# Patient Record
Sex: Male | Born: 1955 | Race: White | Hispanic: No | Marital: Married | Smoking: Former smoker
Health system: Southern US, Community
[De-identification: ages and names within clinical notes are randomized; demographics above are authoritative.]

## PROBLEM LIST (undated history)

## (undated) DIAGNOSIS — E785 Hyperlipidemia, unspecified: Secondary | ICD-10-CM

## (undated) DIAGNOSIS — S21339A Puncture wound without foreign body of unspecified front wall of thorax with penetration into thoracic cavity, initial encounter: Secondary | ICD-10-CM

## (undated) DIAGNOSIS — W3400XA Accidental discharge from unspecified firearms or gun, initial encounter: Secondary | ICD-10-CM

## (undated) DIAGNOSIS — Z8719 Personal history of other diseases of the digestive system: Secondary | ICD-10-CM

## (undated) DIAGNOSIS — I10 Essential (primary) hypertension: Secondary | ICD-10-CM

## (undated) HISTORY — DX: Personal history of other diseases of the digestive system: Z87.19

## (undated) HISTORY — PX: ABDOMINAL SURGERY: SHX537

---

## 2006-10-27 ENCOUNTER — Ambulatory Visit: Payer: Self-pay | Admitting: Gastroenterology

## 2010-08-21 ENCOUNTER — Ambulatory Visit: Payer: Self-pay

## 2011-10-01 ENCOUNTER — Emergency Department: Payer: Self-pay | Admitting: Emergency Medicine

## 2012-04-27 ENCOUNTER — Observation Stay: Payer: Self-pay | Admitting: Surgery

## 2012-04-27 LAB — ETHANOL
Ethanol %: 0.065 % (ref 0.000–0.080)
Ethanol: 65 mg/dL

## 2012-04-27 LAB — COMPREHENSIVE METABOLIC PANEL
Albumin: 4.4 g/dL (ref 3.4–5.0)
Bilirubin,Total: 0.3 mg/dL (ref 0.2–1.0)
Calcium, Total: 8.7 mg/dL (ref 8.5–10.1)
Creatinine: 0.79 mg/dL (ref 0.60–1.30)
EGFR (African American): >60
EGFR (Non-African Amer.): >60
Glucose: 110 mg/dL — ABNORMAL HIGH (ref 65–99)
SGOT(AST): 56 U/L — ABNORMAL HIGH (ref 15–37)
SGPT (ALT): 55 U/L
Sodium: 141 mmol/L (ref 136–145)
Total Protein: 8.1 g/dL (ref 6.4–8.2)

## 2012-04-27 LAB — CBC
HCT: 51.5 % (ref 40.0–52.0)
MCV: 95 fL (ref 80–100)
RDW: 13.9 % (ref 11.5–14.5)

## 2012-04-27 LAB — APTT: Activated PTT: 27.4 secs (ref 23.6–35.9)

## 2012-04-27 LAB — TROPONIN I: Troponin-I: <0.02 ng/mL

## 2012-04-27 LAB — PROTIME-INR: Prothrombin Time: 12.9 secs (ref 11.5–14.7)

## 2012-04-28 LAB — BASIC METABOLIC PANEL
BUN: 14 mg/dL (ref 7–18)
EGFR (African American): >60
EGFR (Non-African Amer.): >60
Glucose: 116 mg/dL — ABNORMAL HIGH (ref 65–99)
Osmolality: 279 (ref 275–301)
Potassium: 3.9 mmol/L (ref 3.5–5.1)

## 2012-04-28 LAB — CBC WITH DIFFERENTIAL/PLATELET
Basophil #: 0 10*3/uL (ref 0.0–0.1)
Basophil %: 0.5 %
Eosinophil #: 0 10*3/uL (ref 0.0–0.7)
Lymphocyte #: 1.2 10*3/uL (ref 1.0–3.6)
Lymphocyte %: 12.4 %
MCH: 30.3 pg (ref 26.0–34.0)
MCV: 94 fL (ref 80–100)
Monocyte #: 1 x10 3/mm (ref 0.2–1.0)
Monocyte %: 10 %
Neutrophil %: 76.9 %
RBC: 4.69 10*6/uL (ref 4.40–5.90)
WBC: 9.6 10*3/uL (ref 3.8–10.6)

## 2013-04-23 IMAGING — CR DG CHEST 1V PORT
1 series · 1 of 1 positions shown · non-contrast
Comparison: none

REASON FOR EXAM: rib fx
COMMENTS:

[portable]
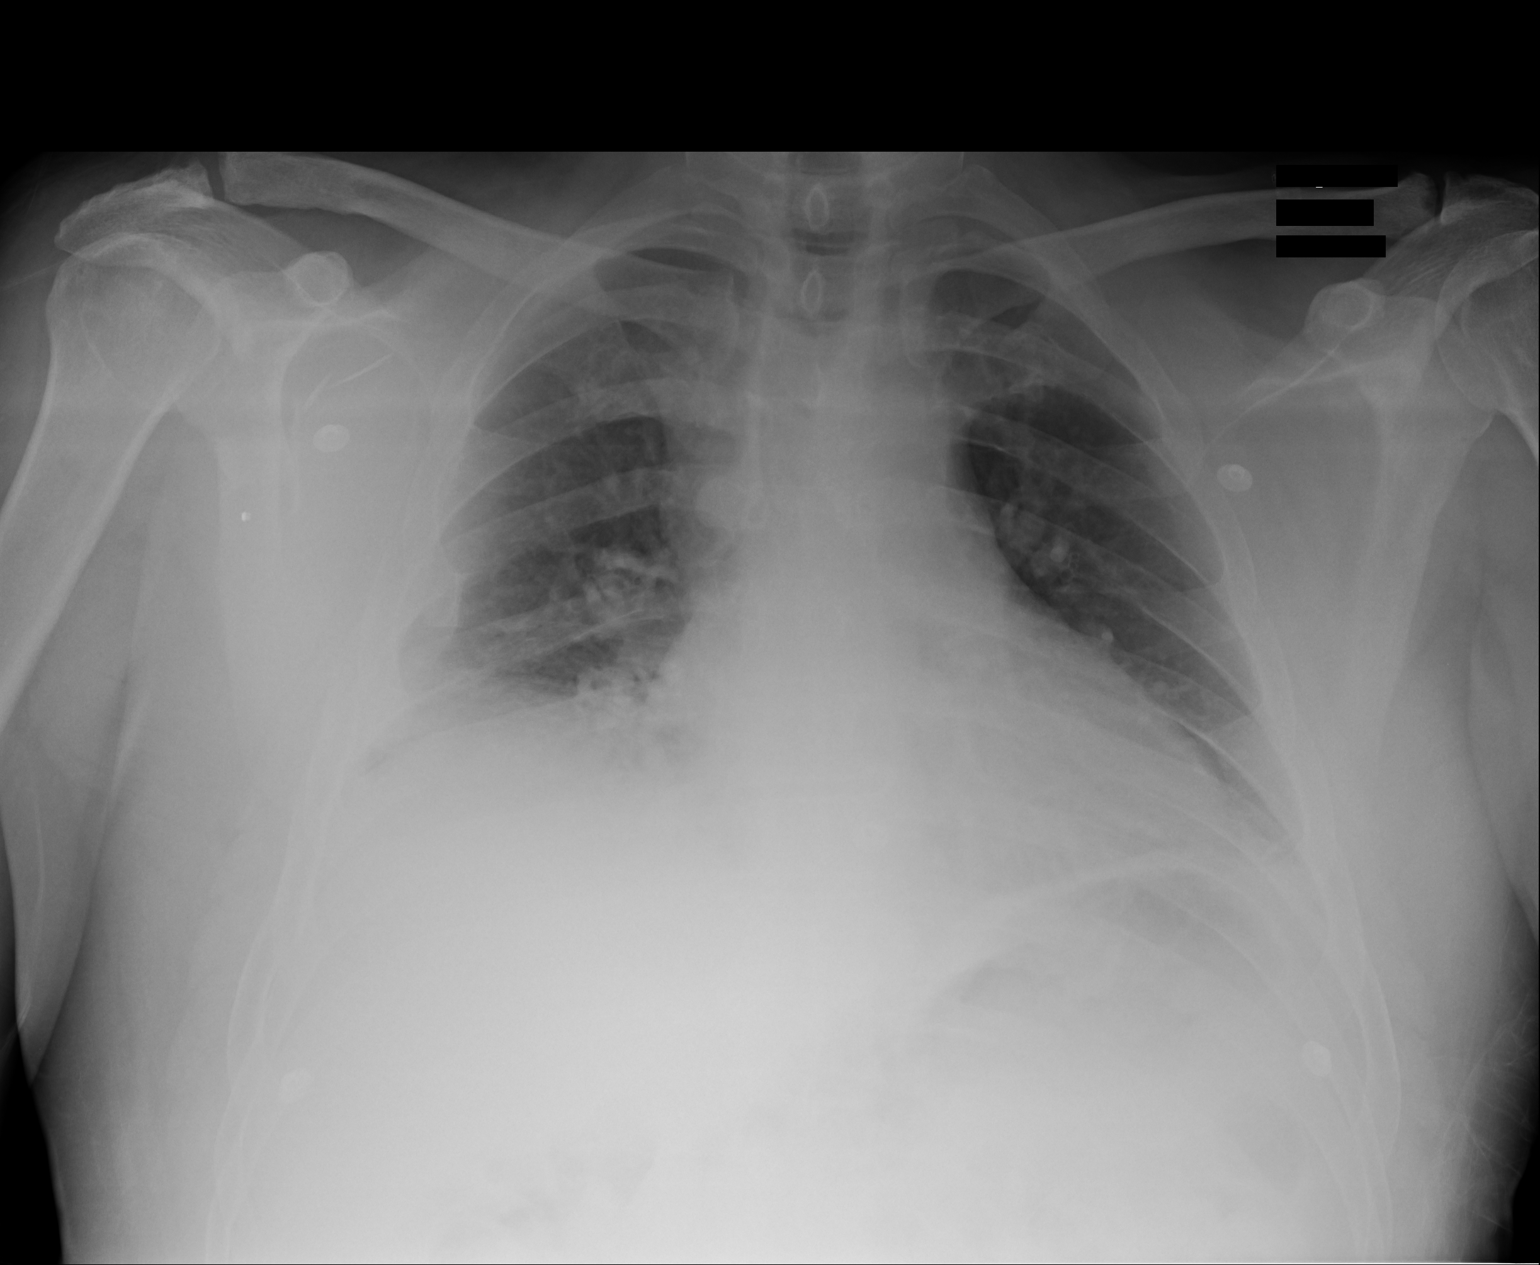

[1 of 1 positions shown; findings below may reference images not displayed]

PROCEDURE:     DXR - DXR PORTABLE CHEST SINGLE VIEW  - April 28, 2012  [DATE]

RESULT:     Comparison is made to prior study dated 08/21/2010.

The patient has taken a shallow inspiration. There is elevation of the right
hemidiaphragm and increased density in the right lung base. Prominence of
the interstitial markings is appreciated. The cardiac silhouette is
moderately enlarged. Surgical clips project within the right axilla region.
The visualized bony skeleton is grossly unremarkable though evaluation is
limited secondary to technique and the patient's shallow inspiration.
IMPRESSION: 1. Atelectasis versus infiltrate right lung base accentuated by the
patient's shallow inspiration.
2. Interstitial prominence which may represent a component of mild pulmonary
edema. Surveillance evaluation recommended.

## 2014-07-26 ENCOUNTER — Ambulatory Visit: Payer: Self-pay | Admitting: Family Medicine

## 2014-07-31 ENCOUNTER — Ambulatory Visit: Payer: Self-pay | Admitting: Gastroenterology

## 2014-08-30 ENCOUNTER — Ambulatory Visit: Payer: Self-pay | Admitting: Gastroenterology

## 2014-08-30 LAB — PROTIME-INR
INR: 1
Prothrombin Time: 12.6 secs (ref 11.5–14.7)

## 2014-08-30 LAB — PLATELET COUNT: Platelet: 274 10*3/uL (ref 150–440)

## 2014-09-04 LAB — PATHOLOGY REPORT

## 2014-09-24 DIAGNOSIS — Z86010 Personal history of colonic polyps: Secondary | ICD-10-CM | POA: Insufficient documentation
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
## 2015-06-12 ENCOUNTER — Other Ambulatory Visit: Payer: Self-pay

## 2015-06-12 MED ORDER — SIMVASTATIN 40 MG PO TABS: 40.0000 mg | ORAL_TABLET | Freq: Every day | ORAL | Status: DC

## 2015-06-12 NOTE — Telephone Encounter (Signed)
Called pharmacy and they stated that the rx is over a year old so a new one needs to be written. Patient was last seen on 03/21/15, practice partner number is [phone], and pharmacy is [facility] in [place].

## 2015-07-16 ENCOUNTER — Other Ambulatory Visit: Payer: Self-pay | Admitting: Unknown Physician Specialty

## 2015-09-17 ENCOUNTER — Other Ambulatory Visit: Payer: Self-pay | Admitting: Unknown Physician Specialty

## 2015-09-25 ENCOUNTER — Other Ambulatory Visit: Payer: Self-pay

## 2015-09-25 NOTE — Telephone Encounter (Signed)
LAST VISIT: 03/24/2015  Request for simvastatin 40 mg tab.   Practice Partner: [phone]

## 2015-09-26 MED ORDER — SIMVASTATIN 40 MG PO TABS: 40.0000 mg | ORAL_TABLET | Freq: Every day | ORAL | Status: DC

## 2015-09-26 NOTE — Telephone Encounter (Signed)
Called patient and told him that [facility] refilled his medication, but he also needs an appointment. He said he was at work right now and he didn't have access to find out, but he'd called tomorrow then said thank you, while I was telling him that we are closed tomorrow he hung up.   If he hasn't called back next week, I will call him again and try to schedule an appointment.

## 2015-11-16 ENCOUNTER — Emergency Department
Admission: EM | Admit: 2015-11-16 | Discharge: 2015-11-16 | Disposition: A | Discharge status: Home / Self Care | Payer: BLUE CROSS/BLUE SHIELD | Attending: Emergency Medicine | Admitting: Emergency Medicine

## 2015-11-16 ENCOUNTER — Emergency Department: Payer: BLUE CROSS/BLUE SHIELD

## 2015-11-16 ENCOUNTER — Encounter: Payer: Self-pay | Admitting: Emergency Medicine

## 2015-11-16 DIAGNOSIS — S29001A Unspecified injury of muscle and tendon of front wall of thorax, initial encounter: Secondary | ICD-10-CM | POA: Diagnosis present

## 2015-11-16 DIAGNOSIS — Y998 Other external cause status: Secondary | ICD-10-CM | POA: Diagnosis not present

## 2015-11-16 DIAGNOSIS — Y9389 Activity, other specified: Secondary | ICD-10-CM | POA: Diagnosis not present

## 2015-11-16 DIAGNOSIS — S299XXA Unspecified injury of thorax, initial encounter: Secondary | ICD-10-CM | POA: Insufficient documentation

## 2015-11-16 DIAGNOSIS — Z791 Long term (current) use of non-steroidal anti-inflammatories (NSAID): Secondary | ICD-10-CM | POA: Insufficient documentation

## 2015-11-16 DIAGNOSIS — Y92410 Unspecified street and highway as the place of occurrence of the external cause: Secondary | ICD-10-CM | POA: Diagnosis not present

## 2015-11-16 DIAGNOSIS — F17210 Nicotine dependence, cigarettes, uncomplicated: Secondary | ICD-10-CM | POA: Insufficient documentation

## 2015-11-16 DIAGNOSIS — S20211A Contusion of right front wall of thorax, initial encounter: Secondary | ICD-10-CM | POA: Diagnosis not present

## 2015-11-16 DIAGNOSIS — S8992XA Unspecified injury of left lower leg, initial encounter: Secondary | ICD-10-CM | POA: Diagnosis not present

## 2015-11-16 DIAGNOSIS — Z79899 Other long term (current) drug therapy: Secondary | ICD-10-CM | POA: Diagnosis not present

## 2015-11-16 DIAGNOSIS — I10 Essential (primary) hypertension: Secondary | ICD-10-CM | POA: Insufficient documentation

## 2015-11-16 HISTORY — DX: Puncture wound without foreign body of unspecified front wall of thorax with penetration into thoracic cavity, initial encounter: S21.339A

## 2015-11-16 HISTORY — DX: Essential (primary) hypertension: I10

## 2015-11-16 HISTORY — DX: Accidental discharge from unspecified firearms or gun, initial encounter: W34.00XA

## 2015-11-16 HISTORY — PX: FRACTURE SURGERY: SHX138

## 2015-11-16 HISTORY — DX: Hyperlipidemia, unspecified: E78.5

## 2015-11-16 MED ORDER — NAPROXEN 500 MG PO TABS
500.0000 mg | ORAL_TABLET | Freq: Once | ORAL | Status: AC
Start: 2015-11-16 — End: 2015-11-16
  Administered 2015-11-16: 500 mg via ORAL
  Filled 2015-11-16: qty 1

## 2015-11-16 MED ORDER — OXYCODONE HCL 5 MG PO TABS
5.0000 mg | ORAL_TABLET | Freq: Three times a day (TID) | ORAL | Status: AC | PRN
Start: 2015-11-16 — End: 2016-11-15

## 2015-11-16 MED ORDER — NAPROXEN 500 MG PO TABS: 500.0000 mg | ORAL_TABLET | Freq: Two times a day (BID) | ORAL | Status: DC
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
## 2015-11-16 NOTE — ED Notes (Signed)
Pt in XR. 
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
## 2015-11-20 ENCOUNTER — Emergency Department
Admission: EM | Admit: 2015-11-20 | Discharge: 2015-11-20 | Disposition: A | Discharge status: Home / Self Care | Payer: BLUE CROSS/BLUE SHIELD | Attending: Emergency Medicine | Admitting: Emergency Medicine

## 2015-11-20 ENCOUNTER — Encounter: Payer: Self-pay | Admitting: Emergency Medicine

## 2015-11-20 DIAGNOSIS — I10 Essential (primary) hypertension: Secondary | ICD-10-CM | POA: Insufficient documentation

## 2015-11-20 DIAGNOSIS — S20212D Contusion of left front wall of thorax, subsequent encounter: Secondary | ICD-10-CM | POA: Insufficient documentation

## 2015-11-20 DIAGNOSIS — R0781 Pleurodynia: Secondary | ICD-10-CM | POA: Diagnosis present

## 2015-11-20 DIAGNOSIS — F17210 Nicotine dependence, cigarettes, uncomplicated: Secondary | ICD-10-CM | POA: Diagnosis not present

## 2015-11-20 DIAGNOSIS — R0789 Other chest pain: Secondary | ICD-10-CM | POA: Diagnosis not present

## 2015-11-20 DIAGNOSIS — Z79899 Other long term (current) drug therapy: Secondary | ICD-10-CM | POA: Diagnosis not present

## 2015-11-20 MED ORDER — CYCLOBENZAPRINE HCL 5 MG PO TABS: 5.0000 mg | ORAL_TABLET | Freq: Three times a day (TID) | ORAL | Status: DC | PRN

## 2015-11-20 MED ORDER — KETOROLAC TROMETHAMINE 10 MG PO TABS: 10.0000 mg | ORAL_TABLET | Freq: Three times a day (TID) | ORAL | Status: DC

## 2015-11-20 MED ORDER — ORPHENADRINE CITRATE 30 MG/ML IJ SOLN
60.0000 mg | INTRAMUSCULAR | Status: AC
  Administered 2015-11-20: 60 mg via INTRAMUSCULAR
  Filled 2015-11-20: qty 2

## 2015-11-20 MED ORDER — KETOROLAC TROMETHAMINE 60 MG/2ML IM SOLN
60.0000 mg | Freq: Once | INTRAMUSCULAR | Status: AC
  Administered 2015-11-20: 60 mg via INTRAMUSCULAR
  Filled 2015-11-20: qty 2

## 2015-11-20 MED ORDER — TRAMADOL HCL 50 MG PO TABS: 50.0000 mg | ORAL_TABLET | Freq: Two times a day (BID) | ORAL | Status: DC

## 2015-11-20 NOTE — Discharge Instructions (Signed)
Chest Wall Pain Chest wall pain is pain in or around the bones and muscles of your chest. Sometimes, an injury causes this pain. Sometimes, the cause may not be known. This pain may take several weeks or longer to get better. HOME CARE Pay attention to any changes in your symptoms. Take these actions to help with your pain:  Rest as told by your doctor.  Avoid activities that cause pain. Try not to use your chest, belly (abdominal), or side muscles to lift heavy things.  If directed, apply ice to the painful area:  Put ice in a plastic bag.  Place a towel between your skin and the bag.  Leave the ice on for 20 minutes, 2-3 times per day.  Take over-the-counter and prescription medicines only as told by your doctor.  Do not use tobacco products, including cigarettes, chewing tobacco, and e-cigarettes. If you need help quitting, ask your doctor.  Keep all follow-up visits as told by your doctor. This is important. GET HELP IF:  You have a fever.  Your chest pain gets worse.  You have new symptoms. GET HELP RIGHT AWAY IF:  You feel sick to your stomach (nauseous) or you throw up (vomit).  You feel sweaty or light-headed.  You have a cough with phlegm (sputum) or you cough up blood.  You are short of breath.   This information is not intended to replace advice given to you by your health care provider. Make sure you discuss any questions you have with your health care provider.   Document Released: 04/19/2008 Document Revised: 07/23/2015 Document Reviewed: 01/27/2015 Elsevier Interactive Patient Education [employer]   Take the prescription meds as directed. Apply ice or warm compresses to promote healing. Follow-up with your provider for ongoing symptoms.
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
## 2015-11-20 NOTE — ED Notes (Addendum)
Pain to left ribs. Unable to be seen at any pcp per pt. 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 Pain worst with bending and sitting.
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
## 2016-09-30 ENCOUNTER — Other Ambulatory Visit: Payer: Self-pay | Admitting: Unknown Physician Specialty

## 2016-11-25 ENCOUNTER — Ambulatory Visit: Payer: BLUE CROSS/BLUE SHIELD | Admitting: Psychology

## 2021-07-06 ENCOUNTER — Emergency Department: Payer: BC Managed Care – PPO

## 2021-07-06 ENCOUNTER — Other Ambulatory Visit: Payer: Self-pay

## 2021-07-06 ENCOUNTER — Emergency Department
Admission: EM | Admit: 2021-07-06 | Discharge: 2021-07-06 | Disposition: A | Discharge status: Home / Self Care | Payer: BC Managed Care – PPO | Attending: Emergency Medicine | Admitting: Emergency Medicine

## 2021-07-06 DIAGNOSIS — R1110 Vomiting, unspecified: Secondary | ICD-10-CM | POA: Diagnosis not present

## 2021-07-06 DIAGNOSIS — R0789 Other chest pain: Secondary | ICD-10-CM | POA: Diagnosis not present

## 2021-07-06 DIAGNOSIS — Z5321 Procedure and treatment not carried out due to patient leaving prior to being seen by health care provider: Secondary | ICD-10-CM | POA: Insufficient documentation

## 2021-07-06 DIAGNOSIS — I10 Essential (primary) hypertension: Secondary | ICD-10-CM | POA: Diagnosis not present

## 2021-07-06 LAB — BASIC METABOLIC PANEL
Anion gap: 12 (ref 5–15)
BUN: 14 mg/dL (ref 8–23)
CO2: 21 mmol/L — ABNORMAL LOW (ref 22–32)
Calcium: 9.8 mg/dL (ref 8.9–10.3)
Chloride: 103 mmol/L (ref 98–111)
Creatinine, Ser: 0.64 mg/dL (ref 0.61–1.24)
GFR, Estimated: >60 mL/min (ref >60)
Glucose, Bld: 155 mg/dL — ABNORMAL HIGH (ref 70–99)
Potassium: 3.9 mmol/L (ref 3.5–5.1)
Sodium: 136 mmol/L (ref 135–145)

## 2021-07-06 LAB — CBC
HCT: 46.1 % (ref 39.0–52.0)
Hemoglobin: 16.7 g/dL (ref 13.0–17.0)
MCH: 33.9 pg (ref 26.0–34.0)
MCHC: 36.2 g/dL — ABNORMAL HIGH (ref 30.0–36.0)
MCV: 93.7 fL (ref 80.0–100.0)
Platelets: 362 10*3/uL (ref 150–400)
RBC: 4.92 MIL/uL (ref 4.22–5.81)
RDW: 14.3 % (ref 11.5–15.5)
WBC: 15.8 10*3/uL — ABNORMAL HIGH (ref 4.0–10.5)
nRBC: 0 % (ref 0.0–0.2)

## 2021-07-06 LAB — TROPONIN I (HIGH SENSITIVITY): Troponin I (High Sensitivity): 10 ng/L (ref <18)

## 2021-07-06 NOTE — ED Notes (Signed)
Patient was called and did not answer. Moved back to waiting area.

## 2021-07-06 NOTE — ED Triage Notes (Signed)
Pt comes with c/o CP that started yesterday. Pt also states HTN and vomiting.

## 2021-09-20 ENCOUNTER — Inpatient Hospital Stay (HOSPITAL_COMMUNITY): Payer: BC Managed Care – PPO

## 2021-09-20 ENCOUNTER — Emergency Department (HOSPITAL_COMMUNITY): Payer: BC Managed Care – PPO

## 2021-09-20 ENCOUNTER — Inpatient Hospital Stay (HOSPITAL_COMMUNITY)
Admission: EM | Admit: 2021-09-20 | Discharge: 2021-09-23 | DRG: 963 | Disposition: A | Discharge status: Home Health | Payer: BC Managed Care – PPO | Attending: General Surgery | Admitting: General Surgery

## 2021-09-20 DIAGNOSIS — Y92488 Other paved roadways as the place of occurrence of the external cause: Secondary | ICD-10-CM | POA: Diagnosis not present

## 2021-09-20 DIAGNOSIS — R578 Other shock: Secondary | ICD-10-CM | POA: Diagnosis present

## 2021-09-20 DIAGNOSIS — I10 Essential (primary) hypertension: Secondary | ICD-10-CM | POA: Diagnosis present

## 2021-09-20 DIAGNOSIS — Z79899 Other long term (current) drug therapy: Secondary | ICD-10-CM | POA: Diagnosis not present

## 2021-09-20 DIAGNOSIS — E785 Hyperlipidemia, unspecified: Secondary | ICD-10-CM | POA: Diagnosis present

## 2021-09-20 DIAGNOSIS — S32810A Multiple fractures of pelvis with stable disruption of pelvic ring, initial encounter for closed fracture: Secondary | ICD-10-CM | POA: Diagnosis present

## 2021-09-20 DIAGNOSIS — S50312A Abrasion of left elbow, initial encounter: Secondary | ICD-10-CM | POA: Diagnosis present

## 2021-09-20 DIAGNOSIS — S22071A Stable burst fracture of T9-T10 vertebra, initial encounter for closed fracture: Secondary | ICD-10-CM | POA: Diagnosis present

## 2021-09-20 DIAGNOSIS — J9601 Acute respiratory failure with hypoxia: Secondary | ICD-10-CM

## 2021-09-20 DIAGNOSIS — F17210 Nicotine dependence, cigarettes, uncomplicated: Secondary | ICD-10-CM | POA: Diagnosis present

## 2021-09-20 DIAGNOSIS — S76112A Strain of left quadriceps muscle, fascia and tendon, initial encounter: Secondary | ICD-10-CM

## 2021-09-20 DIAGNOSIS — E876 Hypokalemia: Secondary | ICD-10-CM | POA: Diagnosis present

## 2021-09-20 DIAGNOSIS — T148XXA Other injury of unspecified body region, initial encounter: Secondary | ICD-10-CM

## 2021-09-20 DIAGNOSIS — K5720 Diverticulitis of large intestine with perforation and abscess without bleeding: Secondary | ICD-10-CM | POA: Diagnosis present

## 2021-09-20 DIAGNOSIS — J939 Pneumothorax, unspecified: Secondary | ICD-10-CM

## 2021-09-20 DIAGNOSIS — J9690 Respiratory failure, unspecified, unspecified whether with hypoxia or hypercapnia: Secondary | ICD-10-CM

## 2021-09-20 DIAGNOSIS — S2242XA Multiple fractures of ribs, left side, initial encounter for closed fracture: Secondary | ICD-10-CM | POA: Diagnosis present

## 2021-09-20 DIAGNOSIS — T1490XA Injury, unspecified, initial encounter: Secondary | ICD-10-CM

## 2021-09-20 DIAGNOSIS — Z20822 Contact with and (suspected) exposure to covid-19: Secondary | ICD-10-CM | POA: Diagnosis present

## 2021-09-20 DIAGNOSIS — S32119A Unspecified Zone I fracture of sacrum, initial encounter for closed fracture: Secondary | ICD-10-CM | POA: Diagnosis present

## 2021-09-20 DIAGNOSIS — S60812A Abrasion of left wrist, initial encounter: Secondary | ICD-10-CM | POA: Diagnosis present

## 2021-09-20 DIAGNOSIS — R0789 Other chest pain: Secondary | ICD-10-CM | POA: Diagnosis present

## 2021-09-20 DIAGNOSIS — E43 Unspecified severe protein-calorie malnutrition: Secondary | ICD-10-CM | POA: Insufficient documentation

## 2021-09-20 DIAGNOSIS — S329XXA Fracture of unspecified parts of lumbosacral spine and pelvis, initial encounter for closed fracture: Secondary | ICD-10-CM | POA: Diagnosis present

## 2021-09-20 DIAGNOSIS — S22088A Other fracture of T11-T12 vertebra, initial encounter for closed fracture: Secondary | ICD-10-CM | POA: Diagnosis present

## 2021-09-20 DIAGNOSIS — S270XXA Traumatic pneumothorax, initial encounter: Secondary | ICD-10-CM | POA: Diagnosis present

## 2021-09-20 LAB — RESP PANEL BY RT-PCR (FLU A&B, COVID) ARPGX2
Influenza A by PCR: NEGATIVE
Influenza B by PCR: NEGATIVE
SARS Coronavirus 2 by RT PCR: NEGATIVE

## 2021-09-20 LAB — COMPREHENSIVE METABOLIC PANEL
ALT: 37 U/L (ref 0–44)
AST: 47 U/L — ABNORMAL HIGH (ref 15–41)
Albumin: 4 g/dL (ref 3.5–5.0)
Alkaline Phosphatase: 90 U/L (ref 38–126)
Anion gap: 16 — ABNORMAL HIGH (ref 5–15)
BUN: 13 mg/dL (ref 8–23)
CO2: 16 mmol/L — ABNORMAL LOW (ref 22–32)
Calcium: 8.9 mg/dL (ref 8.9–10.3)
Chloride: 103 mmol/L (ref 98–111)
Creatinine, Ser: 1.17 mg/dL (ref 0.61–1.24)
GFR, Estimated: >60 mL/min (ref >60)
Glucose, Bld: 182 mg/dL — ABNORMAL HIGH (ref 70–99)
Potassium: 3.8 mmol/L (ref 3.5–5.1)
Sodium: 135 mmol/L (ref 135–145)
Total Bilirubin: 0.8 mg/dL (ref 0.3–1.2)
Total Protein: 7.4 g/dL (ref 6.5–8.1)

## 2021-09-20 LAB — CBC
HCT: 47 % (ref 39.0–52.0)
HCT: 43.1 % (ref 39.0–52.0)
HCT: 40.3 % (ref 39.0–52.0)
Hemoglobin: 16.4 g/dL (ref 13.0–17.0)
Hemoglobin: 14.5 g/dL (ref 13.0–17.0)
Hemoglobin: 14.2 g/dL (ref 13.0–17.0)
MCH: 32.8 pg (ref 26.0–34.0)
MCH: 32.4 pg (ref 26.0–34.0)
MCH: 32.7 pg (ref 26.0–34.0)
MCHC: 34.9 g/dL (ref 30.0–36.0)
MCHC: 33.6 g/dL (ref 30.0–36.0)
MCHC: 35.2 g/dL (ref 30.0–36.0)
MCV: 94 fL (ref 80.0–100.0)
MCV: 96.2 fL (ref 80.0–100.0)
MCV: 92.9 fL (ref 80.0–100.0)
Platelets: 276 10*3/uL (ref 150–400)
Platelets: 259 10*3/uL (ref 150–400)
Platelets: 218 10*3/uL (ref 150–400)
RBC: 5 MIL/uL (ref 4.22–5.81)
RBC: 4.48 MIL/uL (ref 4.22–5.81)
RBC: 4.34 MIL/uL (ref 4.22–5.81)
RDW: 14 % (ref 11.5–15.5)
RDW: 14.2 % (ref 11.5–15.5)
RDW: 14.3 % (ref 11.5–15.5)
WBC: 21 10*3/uL — ABNORMAL HIGH (ref 4.0–10.5)
WBC: 20.6 10*3/uL — ABNORMAL HIGH (ref 4.0–10.5)
WBC: 21.6 10*3/uL — ABNORMAL HIGH (ref 4.0–10.5)
nRBC: 0.1 % (ref 0.0–0.2)
nRBC: 0.1 % (ref 0.0–0.2)
nRBC: 0 % (ref 0.0–0.2)

## 2021-09-20 LAB — LACTIC ACID, PLASMA: Lactic Acid, Venous: 4.3 mmol/L (ref 0.5–1.9)

## 2021-09-20 LAB — I-STAT CHEM 8, ED
BUN: 13 mg/dL (ref 8–23)
Calcium, Ion: 1.02 mmol/L — ABNORMAL LOW (ref 1.15–1.40)
Chloride: 106 mmol/L (ref 98–111)
Creatinine, Ser: 0.9 mg/dL (ref 0.61–1.24)
Glucose, Bld: 179 mg/dL — ABNORMAL HIGH (ref 70–99)
HCT: 51 % (ref 39.0–52.0)
Hemoglobin: 17.3 g/dL — ABNORMAL HIGH (ref 13.0–17.0)
Potassium: 3.5 mmol/L (ref 3.5–5.1)
Sodium: 136 mmol/L (ref 135–145)
TCO2: 15 mmol/L — ABNORMAL LOW (ref 22–32)

## 2021-09-20 LAB — ETHANOL: Alcohol, Ethyl (B): <10 mg/dL (ref <10)

## 2021-09-20 LAB — PROTIME-INR
INR: 1.1 (ref 0.8–1.2)
Prothrombin Time: 14.2 seconds (ref 11.4–15.2)

## 2021-09-20 LAB — MRSA NEXT GEN BY PCR, NASAL: MRSA by PCR Next Gen: NOT DETECTED

## 2021-09-20 LAB — PREPARE RBC (CROSSMATCH)

## 2021-09-20 LAB — ABO/RH: ABO/RH(D): O NEG

## 2021-09-20 LAB — HIV ANTIBODY (ROUTINE TESTING W REFLEX): HIV Screen 4th Generation wRfx: NONREACTIVE

## 2021-09-20 MED ORDER — ONDANSETRON HCL 4 MG/2ML IJ SOLN
INTRAMUSCULAR | Status: AC
  Administered 2021-09-20: 4 mg
  Filled 2021-09-20: qty 2

## 2021-09-20 MED ORDER — HYDROMORPHONE HCL 1 MG/ML IJ SOLN
1.0000 mg | Freq: Once | INTRAMUSCULAR | Status: AC
  Administered 2021-09-20: 1 mg via INTRAVENOUS
  Filled 2021-09-20: qty 1

## 2021-09-20 MED ORDER — IOHEXOL 350 MG/ML SOLN
100.0000 mL | Freq: Once | INTRAVENOUS | Status: AC | PRN
  Administered 2021-09-20: 100 mL via INTRAVENOUS

## 2021-09-20 MED ORDER — ACETAMINOPHEN 325 MG PO TABS
650.0000 mg | ORAL_TABLET | Freq: Four times a day (QID) | ORAL | Status: DC
  Administered 2021-09-20 – 2021-09-21 (×3): 650 mg via ORAL
  Filled 2021-09-20 (×3): qty 2

## 2021-09-20 MED ORDER — PANTOPRAZOLE SODIUM 40 MG PO TBEC
40.0000 mg | DELAYED_RELEASE_TABLET | Freq: Every day | ORAL | Status: DC
  Administered 2021-09-20: 40 mg via ORAL
  Filled 2021-09-20: qty 1

## 2021-09-20 MED ORDER — PIPERACILLIN-TAZOBACTAM 3.375 G IVPB
3.3750 g | Freq: Three times a day (TID) | INTRAVENOUS | Status: DC
  Administered 2021-09-20 – 2021-09-21 (×2): 3.375 g via INTRAVENOUS
  Filled 2021-09-20 (×2): qty 50

## 2021-09-20 MED ORDER — ONDANSETRON 4 MG PO TBDP: 4.0000 mg | ORAL_TABLET | Freq: Four times a day (QID) | ORAL | Status: DC | PRN

## 2021-09-20 MED ORDER — SODIUM CHLORIDE 0.9 % IV SOLN: 10.0000 mL/h | Freq: Once | INTRAVENOUS | Status: DC

## 2021-09-20 MED ORDER — CHLORHEXIDINE GLUCONATE CLOTH 2 % EX PADS
6.0000 | MEDICATED_PAD | Freq: Every day | CUTANEOUS | Status: DC
  Administered 2021-09-20 – 2021-09-22 (×3): 6 via TOPICAL

## 2021-09-20 MED ORDER — ONDANSETRON HCL 4 MG/2ML IJ SOLN
4.0000 mg | Freq: Four times a day (QID) | INTRAMUSCULAR | Status: DC | PRN
  Administered 2021-09-21: 4 mg via INTRAVENOUS
  Filled 2021-09-20: qty 2

## 2021-09-20 MED ORDER — OXYCODONE HCL 5 MG PO TABS
5.0000 mg | ORAL_TABLET | ORAL | Status: DC | PRN
  Administered 2021-09-20: 5 mg via ORAL
  Filled 2021-09-20: qty 1

## 2021-09-20 MED ORDER — IPRATROPIUM-ALBUTEROL 0.5-2.5 (3) MG/3ML IN SOLN
3.0000 mL | Freq: Four times a day (QID) | RESPIRATORY_TRACT | Status: DC
  Administered 2021-09-20 – 2021-09-21 (×2): 3 mL via RESPIRATORY_TRACT
  Filled 2021-09-20 (×2): qty 3

## 2021-09-20 MED ORDER — OXYCODONE HCL 5 MG PO TABS
10.0000 mg | ORAL_TABLET | ORAL | Status: DC | PRN
  Administered 2021-09-20 – 2021-09-21 (×2): 10 mg via ORAL
  Filled 2021-09-20 (×2): qty 2

## 2021-09-20 MED ORDER — LACTATED RINGERS IV SOLN: INTRAVENOUS | Status: DC

## 2021-09-20 MED ORDER — PANTOPRAZOLE SODIUM 40 MG IV SOLR: 40.0000 mg | Freq: Every day | INTRAVENOUS | Status: DC

## 2021-09-20 MED ORDER — METHOCARBAMOL 1000 MG/10ML IJ SOLN
1000.0000 mg | Freq: Three times a day (TID) | INTRAVENOUS | Status: DC
  Administered 2021-09-20 – 2021-09-23 (×9): 1000 mg via INTRAVENOUS
  Filled 2021-09-20 (×7): qty 10
  Filled 2021-09-20 (×2): qty 1000
  Filled 2021-09-20 (×2): qty 10
  Filled 2021-09-20: qty 1000
  Filled 2021-09-20: qty 10

## 2021-09-20 MED ORDER — HYDROMORPHONE HCL 1 MG/ML IJ SOLN
1.0000 mg | INTRAMUSCULAR | Status: DC | PRN
  Administered 2021-09-20 – 2021-09-21 (×4): 1 mg via INTRAVENOUS
  Filled 2021-09-20 (×4): qty 1

## 2021-09-20 MED ORDER — FENTANYL CITRATE PF 50 MCG/ML IJ SOSY
100.0000 ug | PREFILLED_SYRINGE | Freq: Once | INTRAMUSCULAR | Status: AC
  Administered 2021-09-20: 100 ug via INTRAVENOUS
  Filled 2021-09-20: qty 2

## 2021-09-20 NOTE — ED Notes (Addendum)
Dr Janee Morn paged d/t low BP. Orders received for 1U PRBC and 1U FFP, CBC sent.  Verbal consent for emergency blood given by patient and wife Paula Compton at bedside. Rance Muir RN witnessed verbal consent.
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
## 2021-09-20 NOTE — ED Notes (Signed)
Patient transported to CT 

## 2021-09-20 NOTE — ED Notes (Signed)
Dr. Thompson at bedside for chest tube placement 
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
## 2021-09-20 NOTE — Procedures (Signed)
Chest tube insertion  Date/Time: 09/20/2021 1:34 PM Performed by: Violeta Gelinas, MD Authorized by: Violeta Gelinas, MD   Consent:    Consent obtained:  Verbal and emergent situation   Consent given by:  Patient   Alternatives discussed:  No treatment Pre-procedure details:    Skin preparation:  ChloraPrep Anesthesia (see MAR for exact dosages):    Anesthesia method:  Local infiltration Procedure details:    Placement location:  L lateral   Scalpel size:  11   Tube size (Fr):  Minicatheter   Technique: Seldinger     Ultrasound guidance: no     Tension pneumothorax: no     Tube connected to:  Suction   Drainage characteristics:  Air only   Dressing:  4x4 sterile gauze Post-procedure details:    Patient tolerance of procedure:  Tolerated well, no immediate complications Violeta Gelinas, MD, MPH, FACS Please use [email or web address] to contact on call provider

## 2021-09-20 NOTE — ED Notes (Deleted)
v
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
## 2021-09-20 NOTE — ED Notes (Signed)
Delay to CT d/t chest tube being placed.
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
## 2021-09-21 ENCOUNTER — Inpatient Hospital Stay (HOSPITAL_COMMUNITY): Payer: BC Managed Care – PPO

## 2021-09-21 DIAGNOSIS — E43 Unspecified severe protein-calorie malnutrition: Secondary | ICD-10-CM | POA: Insufficient documentation

## 2021-09-21 DIAGNOSIS — E639 Nutritional deficiency, unspecified: Secondary | ICD-10-CM | POA: Insufficient documentation

## 2021-09-21 LAB — TYPE AND SCREEN
ABO/RH(D): O NEG
Antibody Screen: NEGATIVE
Unit division: 0

## 2021-09-21 LAB — SAMPLE TO BLOOD BANK

## 2021-09-21 LAB — BPAM FFP
Blood Product Expiration Date: 202211082359
ISSUE DATE / TIME: 202211061543
Unit Type and Rh: 6200

## 2021-09-21 LAB — CBC
HCT: 37 % — ABNORMAL LOW (ref 39.0–52.0)
Hemoglobin: 12.7 g/dL — ABNORMAL LOW (ref 13.0–17.0)
MCH: 31.8 pg (ref 26.0–34.0)
MCHC: 34.3 g/dL (ref 30.0–36.0)
MCV: 92.7 fL (ref 80.0–100.0)
Platelets: 224 10*3/uL (ref 150–400)
RBC: 3.99 MIL/uL — ABNORMAL LOW (ref 4.22–5.81)
RDW: 14.4 % (ref 11.5–15.5)
WBC: 15.5 10*3/uL — ABNORMAL HIGH (ref 4.0–10.5)
nRBC: 0.1 % (ref 0.0–0.2)

## 2021-09-21 LAB — URINALYSIS, ROUTINE W REFLEX MICROSCOPIC
Bacteria, UA: NONE SEEN
Bilirubin Urine: NEGATIVE
Glucose, UA: NEGATIVE mg/dL
Ketones, ur: NEGATIVE mg/dL
Nitrite: NEGATIVE
Protein, ur: NEGATIVE mg/dL
Specific Gravity, Urine: 1.026 (ref 1.005–1.030)
pH: 5 (ref 5.0–8.0)

## 2021-09-21 LAB — BPAM RBC
Blood Product Expiration Date: 202211202359
ISSUE DATE / TIME: 202211061532
Unit Type and Rh: 5100

## 2021-09-21 LAB — PREPARE FRESH FROZEN PLASMA: Unit division: 0

## 2021-09-21 LAB — BASIC METABOLIC PANEL
Anion gap: 9 (ref 5–15)
BUN: 16 mg/dL (ref 8–23)
CO2: 22 mmol/L (ref 22–32)
Calcium: 7.9 mg/dL — ABNORMAL LOW (ref 8.9–10.3)
Chloride: 102 mmol/L (ref 98–111)
Creatinine, Ser: 0.89 mg/dL (ref 0.61–1.24)
GFR, Estimated: >60 mL/min (ref >60)
Glucose, Bld: 156 mg/dL — ABNORMAL HIGH (ref 70–99)
Potassium: 3.6 mmol/L (ref 3.5–5.1)
Sodium: 133 mmol/L — ABNORMAL LOW (ref 135–145)

## 2021-09-21 LAB — BLOOD PRODUCT ORDER (VERBAL) VERIFICATION

## 2021-09-21 MED ORDER — PROMETHAZINE HCL 25 MG PO TABS
25.0000 mg | ORAL_TABLET | ORAL | Status: DC | PRN
  Administered 2021-09-21: 25 mg via ORAL
  Filled 2021-09-21: qty 1

## 2021-09-21 MED ORDER — KETOROLAC TROMETHAMINE 15 MG/ML IJ SOLN
30.0000 mg | Freq: Four times a day (QID) | INTRAMUSCULAR | Status: DC
  Administered 2021-09-21 – 2021-09-23 (×8): 30 mg via INTRAVENOUS
  Filled 2021-09-21 (×9): qty 2

## 2021-09-21 MED ORDER — LIDOCAINE 5 % EX PTCH
3.0000 | MEDICATED_PATCH | CUTANEOUS | Status: DC
  Administered 2021-09-21: 3 via TRANSDERMAL
  Filled 2021-09-21 (×3): qty 3

## 2021-09-21 MED ORDER — LEVOFLOXACIN IN D5W 750 MG/150ML IV SOLN
750.0000 mg | INTRAVENOUS | Status: DC
  Administered 2021-09-21 – 2021-09-23 (×3): 750 mg via INTRAVENOUS
  Filled 2021-09-21 (×3): qty 150

## 2021-09-21 MED ORDER — SODIUM CHLORIDE 0.9 % IV SOLN
25.0000 mg | INTRAVENOUS | Status: DC | PRN
  Filled 2021-09-21: qty 1

## 2021-09-21 MED ORDER — METRONIDAZOLE 500 MG/100ML IV SOLN
500.0000 mg | Freq: Three times a day (TID) | INTRAVENOUS | Status: DC
  Administered 2021-09-21 – 2021-09-23 (×7): 500 mg via INTRAVENOUS
  Filled 2021-09-21 (×7): qty 100

## 2021-09-21 MED ORDER — ACETAMINOPHEN 500 MG PO TABS
1000.0000 mg | ORAL_TABLET | Freq: Four times a day (QID) | ORAL | Status: DC
  Administered 2021-09-21 – 2021-09-23 (×8): 1000 mg via ORAL
  Filled 2021-09-21 (×9): qty 2

## 2021-09-21 MED ORDER — ENOXAPARIN SODIUM 30 MG/0.3ML IJ SOSY
30.0000 mg | PREFILLED_SYRINGE | Freq: Two times a day (BID) | INTRAMUSCULAR | Status: DC
  Administered 2021-09-21 – 2021-09-23 (×4): 30 mg via SUBCUTANEOUS
  Filled 2021-09-21 (×4): qty 0.3

## 2021-09-21 MED ORDER — ORAL CARE MOUTH RINSE
15.0000 mL | Freq: Two times a day (BID) | OROMUCOSAL | Status: DC
  Administered 2021-09-21 – 2021-09-23 (×5): 15 mL via OROMUCOSAL

## 2021-09-21 MED ORDER — ONDANSETRON HCL 4 MG/2ML IJ SOLN
4.0000 mg | INTRAMUSCULAR | Status: DC | PRN
  Administered 2021-09-22: 4 mg via INTRAVENOUS
  Filled 2021-09-21: qty 2

## 2021-09-21 MED ORDER — OXYCODONE HCL 5 MG PO TABS
15.0000 mg | ORAL_TABLET | ORAL | Status: DC | PRN
  Administered 2021-09-21: 15 mg via ORAL
  Filled 2021-09-21: qty 3

## 2021-09-21 MED ORDER — PROMETHAZINE HCL 25 MG RE SUPP
25.0000 mg | RECTAL | Status: DC | PRN
  Filled 2021-09-21: qty 1

## 2021-09-21 MED ORDER — OXYCODONE HCL 5 MG PO TABS: 10.0000 mg | ORAL_TABLET | ORAL | Status: DC | PRN

## 2021-09-21 MED ORDER — IPRATROPIUM-ALBUTEROL 0.5-2.5 (3) MG/3ML IN SOLN
3.0000 mL | RESPIRATORY_TRACT | Status: DC
  Administered 2021-09-21 (×2): 3 mL via RESPIRATORY_TRACT
  Filled 2021-09-21 (×2): qty 3

## 2021-09-21 MED ORDER — TRAMADOL HCL 50 MG PO TABS
50.0000 mg | ORAL_TABLET | Freq: Four times a day (QID) | ORAL | Status: DC
  Administered 2021-09-21 – 2021-09-23 (×8): 50 mg via ORAL
  Filled 2021-09-21 (×8): qty 1

## 2021-09-21 MED ORDER — BOOST / RESOURCE BREEZE PO LIQD CUSTOM
1.0000 | Freq: Three times a day (TID) | ORAL | Status: DC
  Administered 2021-09-22 – 2021-09-23 (×3): 1 via ORAL

## 2021-09-21 MED ORDER — IPRATROPIUM-ALBUTEROL 0.5-2.5 (3) MG/3ML IN SOLN
3.0000 mL | Freq: Two times a day (BID) | RESPIRATORY_TRACT | Status: DC
  Administered 2021-09-21: 3 mL via RESPIRATORY_TRACT
  Filled 2021-09-21: qty 3

## 2021-09-21 MED ORDER — ONDANSETRON 4 MG PO TBDP: 4.0000 mg | ORAL_TABLET | ORAL | Status: DC | PRN

## 2021-09-21 MED ORDER — LIDOCAINE 5 % EX PTCH: 1.0000 | MEDICATED_PATCH | CUTANEOUS | Status: DC

## 2021-09-21 MED ORDER — IPRATROPIUM-ALBUTEROL 0.5-2.5 (3) MG/3ML IN SOLN: 3.0000 mL | RESPIRATORY_TRACT | Status: DC | PRN

## 2021-09-21 MED ORDER — ADULT MULTIVITAMIN W/MINERALS CH
1.0000 | ORAL_TABLET | Freq: Every day | ORAL | Status: DC
  Administered 2021-09-21 – 2021-09-23 (×3): 1 via ORAL
  Filled 2021-09-21 (×3): qty 1

## 2021-09-21 NOTE — Progress Notes (Signed)
Overall stable.  No new issues or problems overnight.  Motor and sensory function extremities continue to be intact.  Status post T11 and T12 fractures.  I suspect these fractures will be stable and not require any further interventions or bracing.  Plan on getting upright x-rays when patient is able.
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
## 2021-09-21 NOTE — TOC CAGE-AID Note (Signed)
Transition of Care [facility] - CAGE-AID Screening   Patient Details  Name: Eduardo Golden MRN: [id number] Date of Birth: Aug 15, 1956  Clinical Narrative:  Patient here after [facility]. Verbalizes use of THC and drinking about 2 beers a day. Patient does tell me he has a history of drinking much more, approximately 1/2 gallon a week. He has cut down significantly over the years and feels his alcohol use is much more controlled now. He does not feel he needs any additional resources at this time. Patient also endorses smoking a pack of cigarettes a day.  CAGE-AID Screening:    Have You Ever Felt You Ought to Cut Down on Your Drinking or Drug Use?: Yes Have People Annoyed You By Critizing Your Drinking Or Drug Use?: No Have You Felt Bad Or Guilty About Your Drinking Or Drug Use?: Yes Have You Ever Had a Drink or Used Drugs First Thing In The Morning to Steady Your Nerves or to Get Rid of a Hangover?: No CAGE-AID Score: 2  Substance Abuse Education Offered: Yes

## 2021-09-21 NOTE — Progress Notes (Signed)
Discussed with Julio Sicks MD plan for movement. Per Dr. Jordan Likes, pt is ok to sit up straight and stand up if tolerated by pt.

## 2021-09-22 ENCOUNTER — Inpatient Hospital Stay (HOSPITAL_COMMUNITY): Payer: BC Managed Care – PPO

## 2021-09-22 LAB — CBC
HCT: 29.9 % — ABNORMAL LOW (ref 39.0–52.0)
Hemoglobin: 10.3 g/dL — ABNORMAL LOW (ref 13.0–17.0)
MCH: 32.4 pg (ref 26.0–34.0)
MCHC: 34.4 g/dL (ref 30.0–36.0)
MCV: 94 fL (ref 80.0–100.0)
Platelets: 196 10*3/uL (ref 150–400)
RBC: 3.18 MIL/uL — ABNORMAL LOW (ref 4.22–5.81)
RDW: 14.5 % (ref 11.5–15.5)
WBC: 11.9 10*3/uL — ABNORMAL HIGH (ref 4.0–10.5)
nRBC: 0.2 % (ref 0.0–0.2)

## 2021-09-22 LAB — BASIC METABOLIC PANEL WITH GFR
Anion gap: 10 (ref 5–15)
BUN: 17 mg/dL (ref 8–23)
CO2: 23 mmol/L (ref 22–32)
Calcium: 7.9 mg/dL — ABNORMAL LOW (ref 8.9–10.3)
Chloride: 100 mmol/L (ref 98–111)
Creatinine, Ser: 0.69 mg/dL (ref 0.61–1.24)
GFR, Estimated: >60 mL/min
Glucose, Bld: 99 mg/dL (ref 70–99)
Potassium: 3.3 mmol/L — ABNORMAL LOW (ref 3.5–5.1)
Sodium: 133 mmol/L — ABNORMAL LOW (ref 135–145)

## 2021-09-22 MED ORDER — POTASSIUM CHLORIDE CRYS ER 20 MEQ PO TBCR
40.0000 meq | EXTENDED_RELEASE_TABLET | Freq: Once | ORAL | Status: AC
  Administered 2021-09-22: 40 meq via ORAL
  Filled 2021-09-22: qty 2

## 2021-09-22 MED ORDER — DOCUSATE SODIUM 100 MG PO CAPS: 100.0000 mg | ORAL_CAPSULE | Freq: Two times a day (BID) | ORAL | Status: DC | PRN

## 2021-09-22 MED ORDER — POLYETHYLENE GLYCOL 3350 17 G PO PACK
17.0000 g | PACK | Freq: Two times a day (BID) | ORAL | Status: DC
  Administered 2021-09-23: 17 g via ORAL
  Filled 2021-09-22 (×2): qty 1

## 2021-09-22 MED ORDER — OXYCODONE HCL 5 MG PO TABS: 5.0000 mg | ORAL_TABLET | ORAL | Status: DC | PRN

## 2021-09-22 MED ORDER — HYDROMORPHONE HCL 1 MG/ML IJ SOLN: 0.5000 mg | Freq: Four times a day (QID) | INTRAMUSCULAR | Status: DC | PRN

## 2021-09-22 MED ORDER — OXYCODONE HCL 5 MG PO TABS: 10.0000 mg | ORAL_TABLET | ORAL | Status: DC | PRN

## 2021-09-22 MED ORDER — SENNA 8.6 MG PO TABS
2.0000 | ORAL_TABLET | Freq: Every day | ORAL | Status: DC
  Administered 2021-09-22 – 2021-09-23 (×2): 17.2 mg via ORAL
  Filled 2021-09-22 (×2): qty 2

## 2021-09-22 MED ORDER — POTASSIUM CHLORIDE 20 MEQ PO PACK
40.0000 meq | PACK | ORAL | Status: DC
  Administered 2021-09-22: 40 meq via ORAL
  Filled 2021-09-22 (×2): qty 2
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
## 2021-09-22 NOTE — Progress Notes (Signed)
Inpatient Rehab Admissions Coordinator:   Pt. Now supervision level with ambulation with PT/OT recommending HH. CIR will sign off.   Megan Salon, MS, CCC-SLP Rehab Admissions Coordinator  [phone] (celll) [phone] (office)
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
## 2021-09-23 ENCOUNTER — Inpatient Hospital Stay (HOSPITAL_COMMUNITY): Payer: BC Managed Care – PPO

## 2021-09-23 LAB — BASIC METABOLIC PANEL
Anion gap: 8 (ref 5–15)
BUN: 15 mg/dL (ref 8–23)
CO2: 24 mmol/L (ref 22–32)
Calcium: 8 mg/dL — ABNORMAL LOW (ref 8.9–10.3)
Chloride: 101 mmol/L (ref 98–111)
Creatinine, Ser: 0.64 mg/dL (ref 0.61–1.24)
GFR, Estimated: >60 mL/min (ref >60)
Glucose, Bld: 85 mg/dL (ref 70–99)
Potassium: 4.1 mmol/L (ref 3.5–5.1)
Sodium: 133 mmol/L — ABNORMAL LOW (ref 135–145)

## 2021-09-23 LAB — CBC
HCT: 27.3 % — ABNORMAL LOW (ref 39.0–52.0)
Hemoglobin: 9.6 g/dL — ABNORMAL LOW (ref 13.0–17.0)
MCH: 33.3 pg (ref 26.0–34.0)
MCHC: 35.2 g/dL (ref 30.0–36.0)
MCV: 94.8 fL (ref 80.0–100.0)
Platelets: 224 10*3/uL (ref 150–400)
RBC: 2.88 MIL/uL — ABNORMAL LOW (ref 4.22–5.81)
RDW: 14.4 % (ref 11.5–15.5)
WBC: 9.5 10*3/uL (ref 4.0–10.5)
nRBC: 0 % (ref 0.0–0.2)

## 2021-09-23 MED ORDER — NUTRISOURCE FIBER PO PACK
1.0000 | PACK | Freq: Two times a day (BID) | ORAL | Status: DC
Start: 2021-09-23 — End: 2021-09-23
  Administered 2021-09-23: 1 via ORAL
  Filled 2021-09-23 (×2): qty 1

## 2021-09-23 MED ORDER — METRONIDAZOLE 500 MG PO TABS: 500.0000 mg | ORAL_TABLET | Freq: Three times a day (TID) | ORAL | 0 refills | Status: AC

## 2021-09-23 MED ORDER — TAMSULOSIN HCL 0.4 MG PO CAPS
0.4000 mg | ORAL_CAPSULE | Freq: Once | ORAL | Status: AC
  Administered 2021-09-23: 0.4 mg via ORAL
  Filled 2021-09-23: qty 1

## 2021-09-23 MED ORDER — DOCUSATE SODIUM 100 MG PO CAPS
100.0000 mg | ORAL_CAPSULE | Freq: Two times a day (BID) | ORAL | Status: DC
  Administered 2021-09-23: 100 mg via ORAL
  Filled 2021-09-23: qty 1

## 2021-09-23 MED ORDER — METHOCARBAMOL 500 MG PO TABS: 1000.0000 mg | ORAL_TABLET | Freq: Three times a day (TID) | ORAL | 0 refills | Status: DC | PRN

## 2021-09-23 MED ORDER — TRAMADOL HCL 50 MG PO TABS: 50.0000 mg | ORAL_TABLET | ORAL | Status: DC | PRN

## 2021-09-23 MED ORDER — ACETAMINOPHEN 500 MG PO TABS: 1000.0000 mg | ORAL_TABLET | Freq: Four times a day (QID) | ORAL | 0 refills | Status: AC | PRN

## 2021-09-23 MED ORDER — MAGNESIUM HYDROXIDE 400 MG/5ML PO SUSP
30.0000 mL | Freq: Two times a day (BID) | ORAL | Status: DC
Start: 2021-09-23 — End: 2021-09-23
  Administered 2021-09-23: 30 mL via ORAL
  Filled 2021-09-23: qty 30

## 2021-09-23 MED ORDER — BISACODYL 5 MG PO TBEC
10.0000 mg | DELAYED_RELEASE_TABLET | Freq: Every day | ORAL | Status: DC
  Administered 2021-09-23: 10 mg via ORAL
  Filled 2021-09-23: qty 2

## 2021-09-23 MED ORDER — LIDOCAINE 5 % EX PTCH: 1.0000 | MEDICATED_PATCH | CUTANEOUS | 0 refills | Status: DC

## 2021-09-23 MED ORDER — LEVOFLOXACIN 750 MG PO TABS: 750.0000 mg | ORAL_TABLET | Freq: Every day | ORAL | 0 refills | Status: AC

## 2021-09-23 MED ORDER — TRAMADOL HCL 50 MG PO TABS: 50.0000 mg | ORAL_TABLET | ORAL | 0 refills | Status: DC | PRN
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
## 2021-09-23 NOTE — Progress Notes (Signed)
After visit summary reviewed with patient and patient's family.  Patient alert and orient with stable vital signs.  Patient discharge home with spouse.
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
## 2021-09-23 NOTE — TOC Progression Note (Signed)
Transition of Care [facility]) - Progression Note    Patient Details  Name: Eduardo Golden MRN: [id number] Date of Birth: 27-Oct-1956  [facility] CM/SW Contact  Beckie Busing, RN Phone Number:[phone]  09/23/2021, 10:31 AM  Clinical Narrative:    [facility] consulted for patient discharging with [facility] PT/OT recommendations. CM at bedside to offer choice. Patient and wife both have declined HH. No other needs noted at this time. TOC will sign off.   Expected Discharge Plan: IP Rehab Facility Barriers to Discharge: Continued Medical Work up  Expected Discharge Plan and Services Expected Discharge Plan: IP Rehab Facility   Discharge Planning Services: CM Consult   Living arrangements for the past 2 months: Single Family Home Expected Discharge Date: 09/23/21                                     Social Determinants of Health (SDOH) Interventions    Readmission Risk Interventions No flowsheet data found.

## 2022-09-18 IMAGING — DX DG CHEST 1V PORT
1 series · 1 of 1 positions shown · non-contrast
Comparison: Previous studies including the examination done earlier
today

CLINICAL DATA: Pneumothorax

EXAM:
PORTABLE CHEST 1 VIEW

[chest]
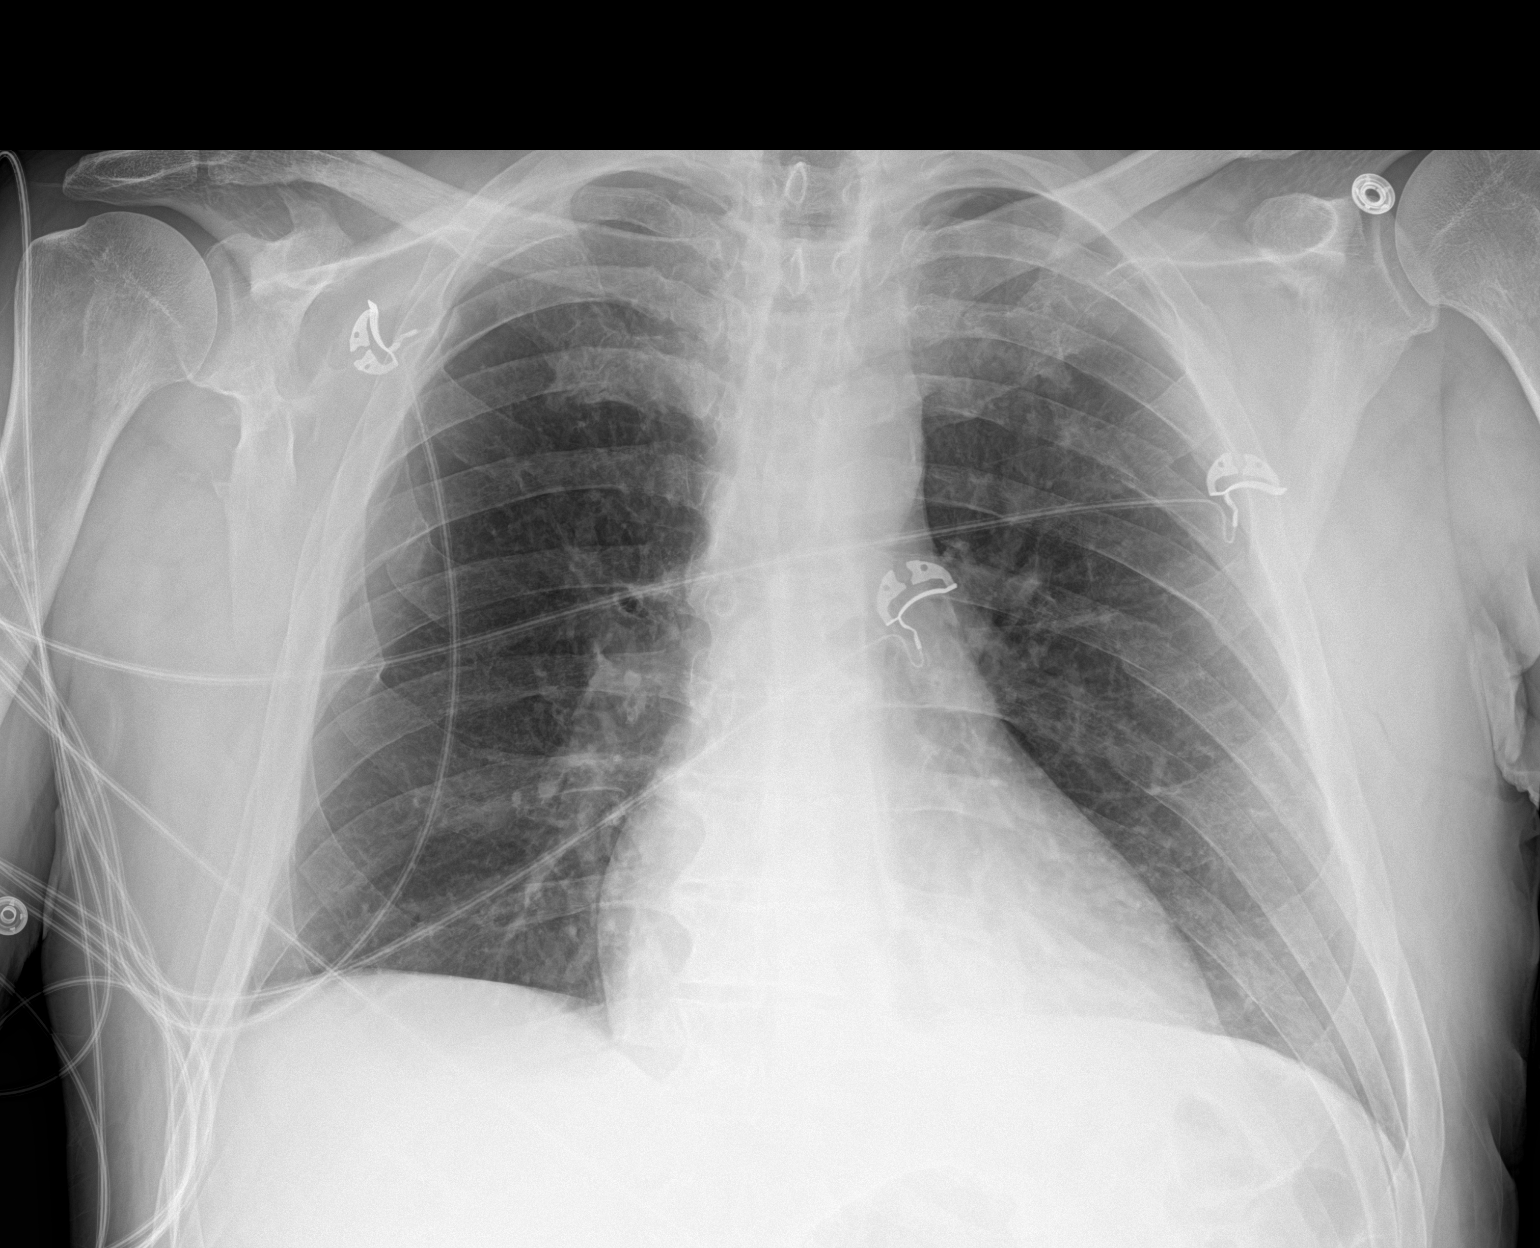

[1 of 1 positions shown; findings below may reference images not displayed]

FINDINGS: Cardiac size is within normal limits. There are no signs of
pulmonary edema. Small infiltrate in the medial left lower lung
fields has not changed. There is interval removal of left chest
tube. There is minimal residual left apical pneumothorax. Fractures
are seen in multiple left ribs with no significant interval change.
IMPRESSION: Interval removal of left chest tube. There is minimal residual left
apical pneumothorax. Small infiltrate in the medial left lower lung
fields suggests subsegmental atelectasis/pneumonitis or contusion.

## 2022-10-14 ENCOUNTER — Other Ambulatory Visit: Payer: Self-pay

## 2022-10-14 ENCOUNTER — Encounter: Payer: Self-pay | Admitting: Gerontology

## 2022-10-14 ENCOUNTER — Ambulatory Visit: Payer: Self-pay | Admitting: Gerontology

## 2022-10-14 VITALS — BP 130/72 | HR 59 | Temp 98.4°F | Resp 16 | Ht 68.5 in | Wt 169.6 lb

## 2022-10-14 DIAGNOSIS — Z8639 Personal history of other endocrine, nutritional and metabolic disease: Secondary | ICD-10-CM

## 2022-10-14 DIAGNOSIS — IMO0001 Reserved for inherently not codable concepts without codable children: Secondary | ICD-10-CM | POA: Insufficient documentation

## 2022-10-14 DIAGNOSIS — F17200 Nicotine dependence, unspecified, uncomplicated: Secondary | ICD-10-CM

## 2022-10-14 DIAGNOSIS — Z7689 Persons encountering health services in other specified circumstances: Secondary | ICD-10-CM | POA: Insufficient documentation

## 2022-10-14 DIAGNOSIS — Z8669 Personal history of other diseases of the nervous system and sense organs: Secondary | ICD-10-CM

## 2022-10-14 DIAGNOSIS — G8929 Other chronic pain: Secondary | ICD-10-CM

## 2022-10-14 DIAGNOSIS — I10 Essential (primary) hypertension: Secondary | ICD-10-CM

## 2022-10-14 MED ORDER — LOSARTAN POTASSIUM 25 MG PO TABS
25.0000 mg | ORAL_TABLET | Freq: Every day | ORAL | 2 refills | Status: DC
  Filled 2022-10-14: qty 30, 30d supply, fill #0
  Filled 2023-01-05: qty 30, 30d supply, fill #1

## 2022-10-14 MED ORDER — ATORVASTATIN CALCIUM 10 MG PO TABS
10.0000 mg | ORAL_TABLET | Freq: Every evening | ORAL | 2 refills | Status: DC
  Filled 2022-10-14: qty 30, 30d supply, fill #0
  Filled 2023-01-05 (×2): qty 30, 30d supply, fill #1
  Filled 2023-01-31 (×2): qty 30, 30d supply, fill #2
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
## 2022-10-14 NOTE — Patient Instructions (Signed)
Heart-Healthy Eating Plan Many factors influence your heart health, including eating and exercise habits. Heart health is also called coronary health. Coronary risk increases with abnormal blood fat (lipid) levels. A heart-healthy eating plan includes limiting unhealthy fats, increasing healthy fats, limiting salt (sodium) intake, and making other diet and lifestyle changes. What is my plan? Your health care provider may recommend that: You limit your fat intake to _________% or less of your total calories each day. You limit your saturated fat intake to _________% or less of your total calories each day. You limit the amount of cholesterol in your diet to less than _________ mg per day. You limit the amount of sodium in your diet to less than _________ mg per day. What are tips for following this plan? Cooking Cook foods using methods other than frying. Baking, boiling, grilling, and broiling are all good options. Other ways to reduce fat include: Removing the skin from poultry. Removing all visible fats from meats. Steaming vegetables in water or broth. Meal planning  At meals, imagine dividing your plate into fourths: Fill one-half of your plate with vegetables and green salads. Fill one-fourth of your plate with whole grains. Fill one-fourth of your plate with lean protein foods. Eat 2-4 cups of vegetables per day. One cup of vegetables equals 1 cup (91 g) broccoli or cauliflower florets, 2 medium carrots, 1 large bell pepper, 1 large sweet potato, 1 large tomato, 1 medium white potato, 2 cups (150 g) raw leafy greens. Eat 1-2 cups of fruit per day. One cup of fruit equals 1 small apple, 1 large banana, 1 cup (237 g) mixed fruit, 1 large orange,  cup (82 g) dried fruit, 1 cup (240 mL) 100% fruit juice. Eat more foods that contain soluble fiber. Examples include apples, broccoli, carrots, beans, peas, and barley. Aim to get 25-30 g of fiber per day. Increase your consumption of legumes,  nuts, and seeds to 4-5 servings per week. One serving of dried beans or legumes equals  cup (90 g) cooked, 1 serving of nuts is  oz (12 almonds, 24 pistachios, or 7 walnut halves), and 1 serving of seeds equals  oz (8 g). Fats Choose healthy fats more often. Choose monounsaturated and polyunsaturated fats, such as olive and canola oils, avocado oil, flaxseeds, walnuts, almonds, and seeds. Eat more omega-3 fats. Choose salmon, mackerel, sardines, tuna, flaxseed oil, and ground flaxseeds. Aim to eat fish at least 2 times each week. Check food labels carefully to identify foods with trans fats or high amounts of saturated fat. Limit saturated fats. These are found in animal products, such as meats, butter, and cream. Plant sources of saturated fats include palm oil, palm kernel oil, and coconut oil. Avoid foods with partially hydrogenated oils in them. These contain trans fats. Examples are stick margarine, some tub margarines, cookies, crackers, and other baked goods. Avoid fried foods. General information Eat more home-cooked food and less restaurant, buffet, and fast food. Limit or avoid alcohol. Limit foods that are high in added sugar and simple starches such as foods made using white refined flour (white breads, pastries, sweets). Lose weight if you are overweight. Losing just 5-10% of your body weight can help your overall health and prevent diseases such as diabetes and heart disease. Monitor your sodium intake, especially if you have high blood pressure. Talk with your health care provider about your sodium intake. Try to incorporate more vegetarian meals weekly. What foods should I eat? Fruits All fresh, canned (in natural   juice), or frozen fruits. Vegetables Fresh or frozen vegetables (raw, steamed, roasted, or grilled). Green salads. Grains Most grains. Choose whole wheat and whole grains most of the time. Rice and pasta, including brown rice and pastas made with whole wheat. Meats  and other proteins Lean, well-trimmed beef, veal, pork, and lamb. Chicken and turkey without skin. All fish and shellfish. Wild duck, rabbit, pheasant, and venison. Egg whites or low-cholesterol egg substitutes. Dried beans, peas, lentils, and tofu. Seeds and most nuts. Dairy Low-fat or nonfat cheeses, including ricotta and mozzarella. Skim or 1% milk (liquid, powdered, or evaporated). Buttermilk made with low-fat milk. Nonfat or low-fat yogurt. Fats and oils Non-hydrogenated (trans-free) margarines. Vegetable oils, including soybean, sesame, sunflower, olive, avocado, peanut, safflower, corn, canola, and cottonseed. Salad dressings or mayonnaise made with a vegetable oil. Beverages Water (mineral or sparkling). Coffee and tea. Unsweetened ice tea. Diet beverages. Sweets and desserts Sherbet, gelatin, and fruit ice. Small amounts of dark chocolate. Limit all sweets and desserts. Seasonings and condiments All seasonings and condiments. The items listed above may not be a complete list of foods and beverages you can eat. Contact a dietitian for more options. What foods should I avoid? Fruits Canned fruit in heavy syrup. Fruit in cream or butter sauce. Fried fruit. Limit coconut. Vegetables Vegetables cooked in cheese, cream, or butter sauce. Fried vegetables. Grains Breads made with saturated or trans fats, oils, or whole milk. Croissants. Sweet rolls. Donuts. High-fat crackers, such as cheese crackers and chips. Meats and other proteins Fatty meats, such as hot dogs, ribs, sausage, bacon, rib-eye roast or steak. High-fat deli meats, such as salami and bologna. Caviar. Domestic duck and goose. Organ meats, such as liver. Dairy Cream, sour cream, cream cheese, and creamed cottage cheese. Whole-milk cheeses. Whole or 2% milk (liquid, evaporated, or condensed). Whole buttermilk. Cream sauce or high-fat cheese sauce. Whole-milk yogurt. Fats and oils Meat fat, or shortening. Cocoa butter,  hydrogenated oils, palm oil, coconut oil, palm kernel oil. Solid fats and shortenings, including bacon fat, salt pork, lard, and butter. Nondairy cream substitutes. Salad dressings with cheese or sour cream. Beverages Regular sodas and any drinks with added sugar. Sweets and desserts Frosting. Pudding. Cookies. Cakes. Pies. Milk chocolate or white chocolate. Buttered syrups. Full-fat ice cream or ice cream drinks. The items listed above may not be a complete list of foods and beverages to avoid. Contact a dietitian for more information. Summary Heart-healthy meal planning includes limiting unhealthy fats, increasing healthy fats, limiting salt (sodium) intake and making other diet and lifestyle changes. Lose weight if you are overweight. Losing just 5-10% of your body weight can help your overall health and prevent diseases such as diabetes and heart disease. Focus on eating a balance of foods, including fruits and vegetables, low-fat or nonfat dairy, lean protein, nuts and legumes, whole grains, and heart-healthy oils and fats. This information is not intended to replace advice given to you by your health care provider. Make sure you discuss any questions you have with your health care provider. Document Revised: 12/07/2021 Document Reviewed: 12/07/2021 Elsevier Patient Education  2023 Else[employer] Inc[employer] DASH Eating Plan DASH stands for Dietary Approaches to Stop Hypertension. The DASH eating plan is a healthy eating plan that has been shown to: Reduce high blood pressure (hypertension). Reduce your risk for type 2 diabetes, heart disease, and stroke. Help with weight loss. What are tips for following this plan? Reading food labels Check food labels for the amount of salt (sodium) per serving. Choose   foods with less than 5 percent of the Daily Value of sodium. Generally, foods with less than 300 milligrams (mg) of sodium per serving fit into this eating plan. To find whole grains, look for the word  "whole" as the first word in the ingredient list. Shopping Buy products labeled as "low-sodium" or "no salt added." Buy fresh foods. Avoid canned foods and pre-made or frozen meals. Cooking Avoid adding salt when cooking. Use salt-free seasonings or herbs instead of table salt or sea salt. Check with your health care provider or pharmacist before using salt substitutes. Do not fry foods. Cook foods using healthy methods such as baking, boiling, grilling, roasting, and broiling instead. Cook with heart-healthy oils, such as olive, canola, avocado, soybean, or sunflower oil. Meal planning  Eat a balanced diet that includes: 4 or more servings of fruits and 4 or more servings of vegetables each day. Try to fill one-half of your plate with fruits and vegetables. 6-8 servings of whole grains each day. Less than 6 oz (170 g) of lean meat, poultry, or fish each day. A 3-oz (85-g) serving of meat is about the same size as a deck of cards. One egg equals 1 oz (28 g). 2-3 servings of low-fat dairy each day. One serving is 1 cup (237 mL). 1 serving of nuts, seeds, or beans 5 times each week. 2-3 servings of heart-healthy fats. Healthy fats called omega-3 fatty acids are found in foods such as walnuts, flaxseeds, fortified milks, and eggs. These fats are also found in cold-water fish, such as sardines, salmon, and mackerel. Limit how much you eat of: Canned or prepackaged foods. Food that is high in trans fat, such as some fried foods. Food that is high in saturated fat, such as fatty meat. Desserts and other sweets, sugary drinks, and other foods with added sugar. Full-fat dairy products. Do not salt foods before eating. Do not eat more than 4 egg yolks a week. Try to eat at least 2 vegetarian meals a week. Eat more home-cooked food and less restaurant, buffet, and fast food. Lifestyle When eating at a restaurant, ask that your food be prepared with less salt or no salt, if possible. If you drink  alcohol: Limit how much you use to: 0-1 drink a day for women who are not pregnant. 0-2 drinks a day for men. Be aware of how much alcohol is in your drink. In the [place] one drink equals one 12 oz bottle of beer (355 mL), one 5 oz glass of wine (148 mL), or one 1 oz glass of hard liquor (44 mL). General information Avoid eating more than 2,300 mg of salt a day. If you have hypertension, you may need to reduce your sodium intake to 1,500 mg a day. Work with your health care provider to maintain a healthy body weight or to lose weight. Ask what an ideal weight is for you. Get at least 30 minutes of exercise that causes your heart to beat faster (aerobic exercise) most days of the week. Activities may include walking, swimming, or biking. Work with your health care provider or dietitian to adjust your eating plan to your individual calorie needs. What foods should I eat? Fruits All fresh, dried, or frozen fruit. Canned fruit in natural juice (without added sugar). Vegetables Fresh or frozen vegetables (raw, steamed, roasted, or grilled). Low-sodium or reduced-sodium tomato and vegetable juice. Low-sodium or reduced-sodium tomato sauce and tomato paste. Low-sodium or reduced-sodium canned vegetables. Grains Whole-grain or whole-wheat bread. Whole-grain or   whole-wheat pasta. Brown rice. Oatmeal. Quinoa. Bulgur. Whole-grain and low-sodium cereals. Pita bread. Low-fat, low-sodium crackers. Whole-wheat flour tortillas. Meats and other proteins Skinless chicken or turkey. Ground chicken or turkey. Pork with fat trimmed off. Fish and seafood. Egg whites. Dried beans, peas, or lentils. Unsalted nuts, nut butters, and seeds. Unsalted canned beans. Lean cuts of beef with fat trimmed off. Low-sodium, lean precooked or cured meat, such as sausages or meat loaves. Dairy Low-fat (1%) or fat-free (skim) milk. Reduced-fat, low-fat, or fat-free cheeses. Nonfat, low-sodium ricotta or cottage cheese. Low-fat or  nonfat yogurt. Low-fat, low-sodium cheese. Fats and oils Soft margarine without trans fats. Vegetable oil. Reduced-fat, low-fat, or light mayonnaise and salad dressings (reduced-sodium). Canola, safflower, olive, avocado, soybean, and sunflower oils. Avocado. Seasonings and condiments Herbs. Spices. Seasoning mixes without salt. Other foods Unsalted popcorn and pretzels. Fat-free sweets. The items listed above may not be a complete list of foods and beverages you can eat. Contact a dietitian for more information. What foods should I avoid? Fruits Canned fruit in a light or heavy syrup. Fried fruit. Fruit in cream or butter sauce. Vegetables Creamed or fried vegetables. Vegetables in a cheese sauce. Regular canned vegetables (not low-sodium or reduced-sodium). Regular canned tomato sauce and paste (not low-sodium or reduced-sodium). Regular tomato and vegetable juice (not low-sodium or reduced-sodium). Pickles. Olives. Grains Baked goods made with fat, such as croissants, muffins, or some breads. Dry pasta or rice meal packs. Meats and other proteins Fatty cuts of meat. Ribs. Fried meat. Bacon. Bologna, salami, and other precooked or cured meats, such as sausages or meat loaves. Fat from the back of a pig (fatback). Bratwurst. Salted nuts and seeds. Canned beans with added salt. Canned or smoked fish. Whole eggs or egg yolks. Chicken or turkey with skin. Dairy Whole or 2% milk, cream, and half-and-half. Whole or full-fat cream cheese. Whole-fat or sweetened yogurt. Full-fat cheese. Nondairy creamers. Whipped toppings. Processed cheese and cheese spreads. Fats and oils Butter. Stick margarine. Lard. Shortening. Ghee. Bacon fat. Tropical oils, such as coconut, palm kernel, or palm oil. Seasonings and condiments Onion salt, garlic salt, seasoned salt, table salt, and sea salt. Worcestershire sauce. Tartar sauce. Barbecue sauce. Teriyaki sauce. Soy sauce, including reduced-sodium. Steak sauce.  Canned and packaged gravies. Fish sauce. Oyster sauce. Cocktail sauce. Store-bought horseradish. Ketchup. Mustard. Meat flavorings and tenderizers. Bouillon cubes. Hot sauces. Pre-made or packaged marinades. Pre-made or packaged taco seasonings. Relishes. Regular salad dressings. Other foods Salted popcorn and pretzels. The items listed above may not be a complete list of foods and beverages you should avoid. Contact a dietitian for more information. Where to find more information National Heart, Lung, and Blood Institute: www.nhlbi.nih.gov American Heart Association: www.heart.org Academy of Nutrition and Dietetics: www.eatright.org National Kidney Foundation: www.kidney.org Summary The DASH eating plan is a healthy eating plan that has been shown to reduce high blood pressure (hypertension). It may also reduce your risk for type 2 diabetes, heart disease, and stroke. When on the DASH eating plan, aim to eat more fresh fruits and vegetables, whole grains, lean proteins, low-fat dairy, and heart-healthy fats. With the DASH eating plan, you should limit salt (sodium) intake to 2,300 mg a day. If you have hypertension, you may need to reduce your sodium intake to 1,500 mg a day. Work with your health care provider or dietitian to adjust your eating plan to your individual calorie needs. This information is not intended to replace advice given to you by your health care provider. Make sure   you discuss any questions you have with your health care provider. Document Revised: 10/05/2019 Document Reviewed: 10/05/2019 Elsevier Patient Education  2023 Elsevier Inc.  

## 2022-10-15 LAB — URINALYSIS
Bilirubin, UA: NEGATIVE
Glucose, UA: NEGATIVE
Ketones, UA: NEGATIVE
Leukocytes,UA: NEGATIVE
Nitrite, UA: NEGATIVE
Protein,UA: NEGATIVE
RBC, UA: NEGATIVE
Specific Gravity, UA: 1.007 (ref 1.005–1.030)
Urobilinogen, Ur: 0.2 mg/dL (ref 0.2–1.0)
pH, UA: 6.5 (ref 5.0–7.5)

## 2022-10-15 LAB — CBC WITH DIFFERENTIAL/PLATELET
Basophils Absolute: 0.1 x10E3/uL (ref 0.0–0.2)
Basos: 1 %
EOS (ABSOLUTE): 0.1 x10E3/uL (ref 0.0–0.4)
Eos: 1 %
Hematocrit: 45.3 % (ref 37.5–51.0)
Hemoglobin: 15.8 g/dL (ref 13.0–17.7)
Immature Grans (Abs): 0 x10E3/uL (ref 0.0–0.1)
Immature Granulocytes: 0 %
Lymphocytes Absolute: 2.9 x10E3/uL (ref 0.7–3.1)
Lymphs: 26 %
MCH: 33.4 pg — ABNORMAL HIGH (ref 26.6–33.0)
MCHC: 34.9 g/dL (ref 31.5–35.7)
MCV: 96 fL (ref 79–97)
Monocytes Absolute: 0.9 x10E3/uL (ref 0.1–0.9)
Monocytes: 8 %
Neutrophils Absolute: 7.2 x10E3/uL — ABNORMAL HIGH (ref 1.4–7.0)
Neutrophils: 64 %
Platelets: 439 x10E3/uL (ref 150–450)
RBC: 4.73 x10E6/uL (ref 4.14–5.80)
RDW: 12.4 % (ref 11.6–15.4)
WBC: 11.2 x10E3/uL — ABNORMAL HIGH (ref 3.4–10.8)

## 2022-10-15 LAB — COMPREHENSIVE METABOLIC PANEL WITH GFR
ALT: 15 IU/L (ref 0–44)
AST: 17 IU/L (ref 0–40)
Albumin/Globulin Ratio: 1.9 (ref 1.2–2.2)
Albumin: 4.7 g/dL (ref 3.9–4.9)
Alkaline Phosphatase: 97 IU/L (ref 44–121)
BUN/Creatinine Ratio: 14 (ref 10–24)
BUN: 10 mg/dL (ref 8–27)
Bilirubin Total: 0.3 mg/dL (ref 0.0–1.2)
CO2: 21 mmol/L (ref 20–29)
Calcium: 9.3 mg/dL (ref 8.6–10.2)
Chloride: 99 mmol/L (ref 96–106)
Creatinine, Ser: 0.7 mg/dL — ABNORMAL LOW (ref 0.76–1.27)
Globulin, Total: 2.5 g/dL (ref 1.5–4.5)
Glucose: 83 mg/dL (ref 70–99)
Potassium: 4.7 mmol/L (ref 3.5–5.2)
Sodium: 139 mmol/L (ref 134–144)
Total Protein: 7.2 g/dL (ref 6.0–8.5)
eGFR: 104 mL/min/1.73

## 2022-10-15 LAB — LIPID PANEL
Chol/HDL Ratio: 4.3 ratio (ref 0.0–5.0)
Cholesterol, Total: 198 mg/dL (ref 100–199)
HDL: 46 mg/dL (ref >39)
LDL Chol Calc (NIH): 122 mg/dL — ABNORMAL HIGH (ref 0–99)
Triglycerides: 169 mg/dL — ABNORMAL HIGH (ref 0–149)
VLDL Cholesterol Cal: 30 mg/dL (ref 5–40)

## 2022-10-15 LAB — HEMOGLOBIN A1C
Est. average glucose Bld gHb Est-mCnc: 111 mg/dL
Hgb A1c MFr Bld: 5.5 % (ref 4.8–5.6)

## 2022-10-28 ENCOUNTER — Ambulatory Visit: Payer: Self-pay | Admitting: Adult Health

## 2022-10-28 ENCOUNTER — Encounter: Payer: Self-pay | Admitting: Adult Health

## 2022-10-28 ENCOUNTER — Other Ambulatory Visit: Payer: Self-pay

## 2022-10-28 VITALS — BP 130/84 | HR 89 | Temp 98.2°F | Wt 169.0 lb

## 2022-10-28 DIAGNOSIS — K5792 Diverticulitis of intestine, part unspecified, without perforation or abscess without bleeding: Secondary | ICD-10-CM

## 2022-10-28 DIAGNOSIS — R1030 Lower abdominal pain, unspecified: Secondary | ICD-10-CM

## 2022-10-28 MED ORDER — POLYETHYLENE GLYCOL 3350 17 G PO PACK
17.0000 g | PACK | Freq: Every day | ORAL | 0 refills | Status: DC
  Filled 2022-10-28: qty 14, 14d supply, fill #0

## 2022-10-28 MED ORDER — METRONIDAZOLE 500 MG PO TABS
500.0000 mg | ORAL_TABLET | Freq: Three times a day (TID) | ORAL | 0 refills | Status: DC
  Filled 2022-10-28: qty 21, 7d supply, fill #0

## 2022-10-28 MED ORDER — LEVOFLOXACIN 750 MG PO TABS
750.0000 mg | ORAL_TABLET | Freq: Every day | ORAL | 0 refills | Status: DC
  Filled 2022-10-28: qty 10, 10d supply, fill #0

## 2022-10-28 NOTE — Progress Notes (Signed)
Patient: Eduardo Golden Male    DOB: [birth year]   66 y.o.   MRN: [id number] Visit Date: 10/28/2022  Today's Provider: Shawn Route, NP   Chief Complaint  Patient presents with   Diverticulitis   Subjective:    HPI [name] is a 66 year old Caucasian male with a history of hypertension, hyperlipidemia, diverticulosis and current everyday smoker who presents with lower abdominal pain that has persisted for over a week.  He attributes the pain to an acute exacerbation of his diverticulosis.  He reports taking 3 tablets of Levaquin over the last 3 days the week before and symptoms improved but once he ran out of the antibiotics his symptoms Got worse.  He reports associated episodes of constipation but denied bloody stools, fever, and nausea.  He had 1 episode of vomiting which he attributed to severe abdominal pain.  He describes pain as mostly localized below the umbilicus and into the left lower quadrant of his abdomen.  He rates the pain as an 8 on 10 in intensity, alternating between dull achy presentation and sharp pain when he sits down to have a bowel movement.  This is a recurrent problem for patient.  His last colonoscopy was in 2015 and he had some polyps excised and all of them were benign.  He has not had a colonoscopy since then and he has not seen a gastroenterologist since then.  Patient continues to smoke 1 pack of cigarettes a day and expresses no desire in quitting.  He denies any urinary symptoms flank pain.  He however has chronic back pain without any interval changes.   Allergies  Allergen Reactions   Penicillins Rash   Previous Medications   ACETAMINOPHEN (TYLENOL) 500 MG TABLET    Take 2 tablets (1,000 mg total) by mouth every 6 (six) hours as needed.   ATORVASTATIN (LIPITOR) 10 MG TABLET    Take 1 tablet (10 mg total) by mouth every evening.   DOCUSATE SODIUM (COLACE) 100 MG CAPSULE    Take 200 mg by mouth daily.   LOSARTAN (COZAAR) 25 MG TABLET    Take 1  tablet (25 mg total) by mouth daily.   NAPROXEN SODIUM (ALEVE) 220 MG TABLET    Take 220 mg by mouth daily as needed (pain).    Review of Systems  Constitutional:  Negative for appetite change and fever.  Respiratory: Negative.    Cardiovascular: Negative.   Gastrointestinal:  Positive for abdominal pain (lower abdomen), constipation and vomiting (one episodes when he had severe abdominal pain). Negative for diarrhea and nausea.  Genitourinary:  Negative for dysuria and flank pain.  Musculoskeletal:  Positive for back pain.    Social History   Tobacco Use   Smoking status: Every Day    Packs/day: 1.00    Years: 50.00    Total pack years: 50.00    Types: Cigarettes   Smokeless tobacco: Never  Substance Use Topics   Alcohol use: Yes    Comment: 1/2 gallon of liquor 1 week out of the month   Objective:   BP 130/84   Pulse 89   Temp 98.2 F (36.8 C) (Oral)   Wt 169 lb (76.7 kg)   SpO2 94%   BMI 25.32 kg/m   Physical Exam Vitals and nursing note reviewed.  Constitutional:      General: He is in acute distress (patient is sitting in a fetal position and holding his lower abdomen).  Cardiovascular:     Rate and Rhythm:  Normal rate and regular rhythm.     Pulses: Normal pulses.  Pulmonary:     Effort: Pulmonary effort is normal.     Breath sounds: Normal breath sounds.  Abdominal:     General: Abdomen is flat. Bowel sounds are normal.     Palpations: Abdomen is soft. There is no mass.     Tenderness: There is abdominal tenderness (suprapubic area and left lower quadrant). There is guarding. There is no left CVA tenderness or rebound.  Neurological:     Mental Status: He is alert and oriented to person, place, and time.         Assessment & Plan:   1. Diverticulitis Will start patient on Levaquin 750 mg daily for 10 days and Flagyl 500 mg every 8 hours x 7 days. Patient advised to take MiraLAX 17 g p.o. daily.  Hold for  loose stools He will need to be under the  care of a gastroenterologist.  Referral for gastroenterology has been initiated. Patient will need charity care applications for [place] [facility] [place] [facility]. He is to go to the emergency room if he develops rectal bleeding or if abdominal pain gets worse. Risk and benefits of cephalosporins reviewed with patient.  He is to stop the medication and go to the emergency room or call the clinic for any tendon pain.  His baseline creatinine is normal hence no renal adjustment needed. - Ambulatory referral to Gastroenterology  2. Lower abdominal pain Patient's pain is mostly around the suprapubic area in the left lower quadrant.  Even though his presentation favors more of acute diverticulitis the possibility of acute cystitis cannot be ruled out.  We have obtained a urine sample which will be sent out for urinalysis and we will also obtain a PSA during patient's next visit.  He is to take as needed Tylenol alternating with ibuprofen for pain. - PSA; Future - Urinalysis, Routine w reflex microscopic  3.  Tobacco use disorder: Patient is not ready to stop smoking.  Will reapproach during next visit. Patient has been advised that the clinic will be closed for the holidays so if symptoms do not improve he should go to the emergency room He is to follow-up with the clinic on 25 November 2022.   Deforest Hoyles, NP   [facility]

## 2022-10-28 NOTE — Patient Instructions (Signed)
Diverticulitis  Diverticulitis is when small pouches in your colon (large intestine) get infected or swollen. This causes pain in the belly (abdomen) and watery poop (diarrhea). These pouches are called diverticula. The pouches form in people who have a condition called diverticulosis. What are the causes? This condition may be caused by poop (stool) that gets trapped in the pouches in your colon. The poop lets germs (bacteria) grow in the pouches. This causes the infection. What increases the risk? You are more likely to get this condition if you have small pouches in your colon. The risk is higher if: You are overweight or very overweight (obese). You do not exercise enough. You drink alcohol. You smoke or use products with tobacco in them. You eat a diet that has a lot of red meat such as beef, pork, or lamb. You eat a diet that does not have enough fiber in it. You are older than 64 years of age. What are the signs or symptoms? Pain in the belly. Pain is often on the left side, but it may be in other areas. Fever and feeling cold. Feeling like you may vomit. Vomiting. Having cramps. Feeling full. Changes to how often you poop. Blood in your poop. How is this treated? Most cases are treated at home by: Taking over-the-counter pain medicines. Following a clear liquid diet. Taking antibiotic medicines. Resting. Very bad cases may need to be treated at a hospital. This may include: Not eating or drinking. Taking prescription pain medicine. Getting antibiotic medicines through an IV tube. Getting fluid and food through an IV tube. Having surgery. When you are feeling better, your doctor may tell you to have a test to check your colon (colonoscopy). Follow these instructions at home: Medicines Take over-the-counter and prescription medicines only as told by your doctor. These include: Antibiotics. Pain medicines. Fiber pills. Probiotics. Stool softeners. If you were  prescribed an antibiotic medicine, take it as told by your doctor. Do not stop taking the antibiotic even if you start to feel better. Ask your doctor if the medicine prescribed to you requires you to avoid driving or using machinery. Eating and drinking  Follow a diet as told by your doctor. When you feel better, your doctor may tell you to change your diet. You may need to eat a lot of fiber. Fiber makes it easier to poop (have a bowel movement). Foods with fiber include: Berries. Beans. Lentils. Green vegetables. Avoid eating red meat. General instructions Do not use any products that contain nicotine or tobacco, such as cigarettes, e-cigarettes, and chewing tobacco. If you need help quitting, ask your doctor. Exercise 3 or more times a week. Try to get 30 minutes each time. Exercise enough to sweat and make your heart beat faster. Keep all follow-up visits as told by your doctor. This is important. Contact a doctor if: Your pain does not get better. You are not pooping like normal. Get help right away if: Your pain gets worse. Your symptoms do not get better. Your symptoms get worse very fast. You have a fever. You vomit more than one time. You have poop that is: Bloody. Black. Tarry. Summary This condition happens when small pouches in your colon get infected or swollen. Take medicines only as told by your doctor. Follow a diet as told by your doctor. Keep all follow-up visits as told by your doctor. This is important. This information is not intended to replace advice given to you by your health care provider. Make sure   you discuss any questions you have with your health care provider. Document Revised: 08/13/2019 Document Reviewed: 08/13/2019 Elsevier Patient Education  2023 Elsevier Inc.  

## 2022-10-29 LAB — MICROSCOPIC EXAMINATION
Bacteria, UA: NONE SEEN
Casts: NONE SEEN /lpf
Epithelial Cells (non renal): NONE SEEN /hpf (ref 0–10)
RBC, Urine: >30 /hpf — AB (ref 0–2)

## 2022-10-29 LAB — URINALYSIS, ROUTINE W REFLEX MICROSCOPIC
Glucose, UA: NEGATIVE
Nitrite, UA: POSITIVE — AB
Specific Gravity, UA: >=1.03 — AB (ref 1.005–1.030)
Urobilinogen, Ur: 1 mg/dL (ref 0.2–1.0)
pH, UA: 5.5 (ref 5.0–7.5)

## 2022-11-25 ENCOUNTER — Encounter: Payer: Self-pay | Admitting: Gerontology

## 2022-11-25 ENCOUNTER — Ambulatory Visit: Payer: Self-pay | Admitting: Gerontology

## 2022-11-25 VITALS — BP 116/69 | HR 62 | Wt 166.0 lb

## 2022-11-25 DIAGNOSIS — F17200 Nicotine dependence, unspecified, uncomplicated: Secondary | ICD-10-CM

## 2022-11-25 DIAGNOSIS — R8290 Unspecified abnormal findings in urine: Secondary | ICD-10-CM

## 2022-11-25 DIAGNOSIS — I10 Essential (primary) hypertension: Secondary | ICD-10-CM

## 2022-11-25 NOTE — Patient Instructions (Signed)
Smoking Tobacco Information, Adult Smoking tobacco can be harmful to your health. Tobacco contains a toxic colorless chemical called nicotine. Nicotine causes changes in your brain that make you want more and more. This is called addiction. This can make it hard to stop smoking once you start. Tobacco also has other toxic chemicals that can hurt your body and raise your risk of many cancers. Menthol or "lite" tobacco or cigarette brands are not safer than regular brands. How can smoking tobacco affect me? Smoking tobacco puts you at risk for: Cancer. Smoking is most commonly associated with lung cancer, but can also lead to cancer in other parts of the body. Chronic obstructive pulmonary disease (COPD). This is a long-term lung condition that makes it hard to breathe. It also gets worse over time. High blood pressure (hypertension), heart disease, stroke, heart attack, and lung infections, such as pneumonia. Cataracts. This is when the lenses in the eyes become clouded. Digestive problems. This may include peptic ulcers, heartburn, and gastroesophageal reflux disease (GERD). Oral health problems, such as gum disease, mouth sores, and tooth loss. Loss of taste and smell. Smoking also affects how you look and smell. Smoking may cause: Wrinkles. Yellow or stained teeth, fingers, and fingernails. Bad breath. Bad-smelling clothes and hair. Smoking tobacco can also affect your social life, because: It may be challenging to find places to smoke when away from home. Many workplaces, restaurants, hotels, and public places are tobacco-free. Smoking is expensive. This is due to the cost of tobacco and the long-term costs of treating health problems from smoking. Secondhand smoke may affect those around you. Secondhand smoke can cause lung cancer, breathing problems, and heart disease. Children of smokers have a higher risk for: Sudden infant death syndrome (SIDS). Ear infections. Lung infections. What  actions can I take to prevent health problems? Quit smoking  Do not start smoking. Quit if you already smoke. Do not replace cigarette smoking with vaping devices, such as e-cigarettes. Make a plan to quit smoking and commit to it. Look for programs to help you, and ask your health care provider for recommendations and ideas. Set a date and write down all the reasons you want to quit. Let your friends and family know you are quitting so they can help and support you. Consider finding friends who also want to quit. It can be easier to quit with someone else, so that you can support each other. Talk with your health care provider about using nicotine replacement medicines to help you quit. These include gum, lozenges, patches, sprays, or pills. If you try to quit but return to smoking, stay positive. It is common to slip up when you first quit, so take it one day at a time. Be prepared for cravings. When you feel the urge to smoke, chew gum or suck on hard candy. Lifestyle Stay busy. Take care of your body. Get plenty of exercise, eat a healthy diet, and drink plenty of water. Find ways to manage your stress, such as meditation, yoga, exercise, or time spent with friends and family. Ask your health care provider about having regular tests (screenings) to check for cancer. This may include blood tests, imaging tests, and other tests. Where to find support To get support to quit smoking, consider: Asking your health care provider for more information and resources. Joining a support group for people who want to quit smoking in your local community. There are many effective programs that may help you to quit. Calling the smokefree.gov counselor   helpline at 1-800-QUIT-NOW 970-196-4616). Where to find more information You may find more information about quitting smoking from: Centers for Disease Control and Prevention: https://www.chang-huffman.com/ https://hall.com/: smokefree.gov American Lung Association:  freedomfromsmoking.org Contact a health care provider if: You have problems breathing. Your lips, nose, or fingers turn blue. You have chest pain. You are coughing up blood. You feel like you will faint. You have other health changes that cause you to worry. Summary Smoking tobacco can negatively affect your health, the health of those around you, your finances, and your social life. Do not start smoking. Quit if you already smoke. If you need help quitting, ask your health care provider. Consider joining a support group for people in your local community who want to quit smoking. There are many effective programs that may help you to quit. This information is not intended to replace advice given to you by your health care provider. Make sure you discuss any questions you have with your health care provider. Document Revised: 10/27/2021 Document Reviewed: 10/27/2021 Elsevier Patient Education  [place] Eating Plan DASH stands for Dietary Approaches to Stop Hypertension. The DASH eating plan is a healthy eating plan that has been shown to: Reduce high blood pressure (hypertension). Reduce your risk for type 2 diabetes, heart disease, and stroke. Help with weight loss. What are tips for following this plan? Reading food labels Check food labels for the amount of salt (sodium) per serving. Choose foods with less than 5 percent of the Daily Value of sodium. Generally, foods with less than 300 milligrams (mg) of sodium per serving fit into this eating plan. To find whole grains, look for the word "whole" as the first word in the ingredient list. Shopping Buy products labeled as "low-sodium" or "no salt added." Buy fresh foods. Avoid canned foods and pre-made or frozen meals. Cooking Avoid adding salt when cooking. Use salt-free seasonings or herbs instead of table salt or sea salt. Check with your health care provider or pharmacist before using salt substitutes. Do not fry  foods. Cook foods using healthy methods such as baking, boiling, grilling, roasting, and broiling instead. Cook with heart-healthy oils, such as olive, canola, avocado, soybean, or sunflower oil. Meal planning  Eat a balanced diet that includes: 4 or more servings of fruits and 4 or more servings of vegetables each day. Try to fill one-half of your plate with fruits and vegetables. 6-8 servings of whole grains each day. Less than 6 oz (170 g) of lean meat, poultry, or fish each day. A 3-oz (85-g) serving of meat is about the same size as a deck of cards. One egg equals 1 oz (28 g). 2-3 servings of low-fat dairy each day. One serving is 1 cup (237 mL). 1 serving of nuts, seeds, or beans 5 times each week. 2-3 servings of heart-healthy fats. Healthy fats called omega-3 fatty acids are found in foods such as walnuts, flaxseeds, fortified milks, and eggs. These fats are also found in cold-water fish, such as sardines, salmon, and mackerel. Limit how much you eat of: Canned or prepackaged foods. Food that is high in trans fat, such as some fried foods. Food that is high in saturated fat, such as fatty meat. Desserts and other sweets, sugary drinks, and other foods with added sugar. Full-fat dairy products. Do not salt foods before eating. Do not eat more than 4 egg yolks a week. Try to eat at least 2 vegetarian meals a week. Eat more home-cooked food and less restaurant,  buffet, and fast food. Lifestyle When eating at a restaurant, ask that your food be prepared with less salt or no salt, if possible. If you drink alcohol: Limit how much you use to: 0-1 drink a day for women who are not pregnant. 0-2 drinks a day for men. Be aware of how much alcohol is in your drink. In the [place] one drink equals one 12 oz bottle of beer (355 mL), one 5 oz glass of wine (148 mL), or one 1 oz glass of hard liquor (44 mL). General information Avoid eating more than 2,300 mg of salt a day. If you have  hypertension, you may need to reduce your sodium intake to 1,500 mg a day. Work with your health care provider to maintain a healthy body weight or to lose weight. Ask what an ideal weight is for you. Get at least 30 minutes of exercise that causes your heart to beat faster (aerobic exercise) most days of the week. Activities may include walking, swimming, or biking. Work with your health care provider or dietitian to adjust your eating plan to your individual calorie needs. What foods should I eat? Fruits All fresh, dried, or frozen fruit. Canned fruit in natural juice (without added sugar). Vegetables Fresh or frozen vegetables (raw, steamed, roasted, or grilled). Low-sodium or reduced-sodium tomato and vegetable juice. Low-sodium or reduced-sodium tomato sauce and tomato paste. Low-sodium or reduced-sodium canned vegetables. Grains Whole-grain or whole-wheat bread. Whole-grain or whole-wheat pasta. Brown rice. [place]. Bulgur. Whole-grain and low-sodium cereals. Pita bread. Low-fat, low-sodium crackers. Whole-wheat flour tortillas. Meats and other proteins Skinless chicken or [place]. Ground chicken or [place]. Pork with fat trimmed off. Fish and seafood. Egg whites. Dried beans, peas, or lentils. Unsalted nuts, nut butters, and seeds. Unsalted canned beans. Lean cuts of beef with fat trimmed off. Low-sodium, lean precooked or cured meat, such as sausages or meat loaves. Dairy Low-fat (1%) or fat-free (skim) milk. Reduced-fat, low-fat, or fat-free cheeses. Nonfat, low-sodium ricotta or cottage cheese. Low-fat or nonfat yogurt. Low-fat, low-sodium cheese. Fats and oils Soft margarine without trans fats. Vegetable oil. Reduced-fat, low-fat, or light mayonnaise and salad dressings (reduced-sodium). Canola, safflower, olive, avocado, soybean, and sunflower oils. Avocado. Seasonings and condiments Herbs. Spices. Seasoning mixes without salt. Other foods Unsalted popcorn and pretzels. Fat-free  sweets. The items listed above may not be a complete list of foods and beverages you can eat. Contact a dietitian for more information. What foods should I avoid? Fruits Canned fruit in a light or heavy syrup. Fried fruit. Fruit in cream or butter sauce. Vegetables Creamed or fried vegetables. Vegetables in a cheese sauce. Regular canned vegetables (not low-sodium or reduced-sodium). Regular canned tomato sauce and paste (not low-sodium or reduced-sodium). Regular tomato and vegetable juice (not low-sodium or reduced-sodium). Angie Fava. Olives. Grains Baked goods made with fat, such as croissants, muffins, or some breads. Dry pasta or rice meal packs. Meats and other proteins Fatty cuts of meat. Ribs. Fried meat. Berniece Salines. Bologna, salami, and other precooked or cured meats, such as sausages or meat loaves. Fat from the back of a pig (fatback). Bratwurst. Salted nuts and seeds. Canned beans with added salt. Canned or smoked fish. Whole eggs or egg yolks. Chicken or [place] with skin. Dairy Whole or 2% milk, cream, and half-and-half. Whole or full-fat cream cheese. Whole-fat or sweetened yogurt. Full-fat cheese. Nondairy creamers. Whipped toppings. Processed cheese and cheese spreads. Fats and oils Butter. Stick margarine. Lard. Shortening. Ghee. Bacon fat. Tropical oils, such as coconut, palm  kernel, or palm oil. Seasonings and condiments Onion salt, garlic salt, seasoned salt, table salt, and sea salt. Worcestershire sauce. Tartar sauce. Barbecue sauce. Teriyaki sauce. Soy sauce, including reduced-sodium. Steak sauce. Canned and packaged gravies. Fish sauce. Oyster sauce. Cocktail sauce. Store-bought horseradish. Ketchup. Mustard. Meat flavorings and tenderizers. Bouillon cubes. Hot sauces. Pre-made or packaged marinades. Pre-made or packaged taco seasonings. Relishes. Regular salad dressings. Other foods Salted popcorn and pretzels. The items listed above may not be a complete list of foods and  beverages you should avoid. Contact a dietitian for more information. Where to find more information National Heart, Lung, and Blood Institute: www.nhlbi.nih.gov American Heart Association: www.heart.org Academy of Nutrition and Dietetics: www.eatright.org National Kidney Foundation: www.kidney.org Summary The DASH eating plan is a healthy eating plan that has been shown to reduce high blood pressure (hypertension). It may also reduce your risk for type 2 diabetes, heart disease, and stroke. When on the DASH eating plan, aim to eat more fresh fruits and vegetables, whole grains, lean proteins, low-fat dairy, and heart-healthy fats. With the DASH eating plan, you should limit salt (sodium) intake to 2,300 mg a day. If you have hypertension, you may need to reduce your sodium intake to 1,500 mg a day. Work with your health care provider or dietitian to adjust your eating plan to your individual calorie needs. This information is not intended to replace advice given to you by your health care provider. Make sure you discuss any questions you have with your health care provider. Document Revised: 10/05/2019 Document Reviewed: 10/05/2019 Elsevier Patient Education  2023 Elsevier Inc.  
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
## 2022-11-29 LAB — UA/M W/RFLX CULTURE, ROUTINE
Bilirubin, UA: NEGATIVE
Glucose, UA: NEGATIVE
Ketones, UA: NEGATIVE
Nitrite, UA: NEGATIVE
Protein,UA: NEGATIVE
RBC, UA: NEGATIVE
Specific Gravity, UA: 1.016 (ref 1.005–1.030)
Urobilinogen, Ur: 1 mg/dL (ref 0.2–1.0)
pH, UA: 6.5 (ref 5.0–7.5)

## 2022-11-29 LAB — URINE CULTURE, REFLEX

## 2022-11-29 LAB — MICROSCOPIC EXAMINATION
Bacteria, UA: NONE SEEN
Casts: NONE SEEN /lpf
Epithelial Cells (non renal): NONE SEEN /hpf (ref 0–10)
RBC, Urine: NONE SEEN /hpf (ref 0–2)
WBC, UA: NONE SEEN /hpf (ref 0–5)

## 2022-12-01 ENCOUNTER — Ambulatory Visit: Payer: Self-pay

## 2022-12-02 ENCOUNTER — Other Ambulatory Visit: Payer: Self-pay

## 2022-12-02 ENCOUNTER — Ambulatory Visit: Payer: Self-pay

## 2023-01-05 ENCOUNTER — Other Ambulatory Visit: Payer: Self-pay

## 2023-01-31 ENCOUNTER — Other Ambulatory Visit: Payer: Self-pay

## 2023-02-15 ENCOUNTER — Other Ambulatory Visit: Payer: Self-pay

## 2023-02-15 DIAGNOSIS — M25511 Pain in right shoulder: Secondary | ICD-10-CM

## 2023-02-24 ENCOUNTER — Ambulatory Visit: Payer: Medicaid Other | Admitting: Gerontology

## 2023-02-24 ENCOUNTER — Other Ambulatory Visit: Payer: Self-pay

## 2023-02-24 ENCOUNTER — Encounter: Payer: Self-pay | Admitting: Gerontology

## 2023-02-24 VITALS — BP 116/72 | HR 58 | Temp 98.0°F | Resp 16 | Ht 69.0 in | Wt 161.2 lb

## 2023-02-24 DIAGNOSIS — R0602 Shortness of breath: Secondary | ICD-10-CM

## 2023-02-24 DIAGNOSIS — I10 Essential (primary) hypertension: Secondary | ICD-10-CM

## 2023-02-24 DIAGNOSIS — R1030 Lower abdominal pain, unspecified: Secondary | ICD-10-CM

## 2023-02-24 DIAGNOSIS — E782 Mixed hyperlipidemia: Secondary | ICD-10-CM

## 2023-02-24 MED ORDER — ATORVASTATIN CALCIUM 10 MG PO TABS
10.0000 mg | ORAL_TABLET | Freq: Every evening | ORAL | 2 refills | Status: DC
  Filled 2023-02-24: qty 90, 90d supply, fill #0

## 2023-02-24 MED ORDER — ALBUTEROL SULFATE HFA 108 (90 BASE) MCG/ACT IN AERS
1.0000 | INHALATION_SPRAY | Freq: Four times a day (QID) | RESPIRATORY_TRACT | 2 refills | Status: AC | PRN
  Filled 2023-02-24: qty 6.7, 25d supply, fill #0
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
## 2023-02-25 ENCOUNTER — Other Ambulatory Visit: Payer: Self-pay

## 2023-02-25 LAB — MICROSCOPIC EXAMINATION
Bacteria, UA: NONE SEEN
Casts: NONE SEEN /lpf
Epithelial Cells (non renal): NONE SEEN /hpf (ref 0–10)
WBC, UA: NONE SEEN /hpf (ref 0–5)

## 2023-02-25 LAB — LIPID PANEL
Chol/HDL Ratio: 3.4 ratio (ref 0.0–5.0)
Cholesterol, Total: 189 mg/dL (ref 100–199)
HDL: 56 mg/dL (ref >39)
LDL Chol Calc (NIH): 107 mg/dL — ABNORMAL HIGH (ref 0–99)
Triglycerides: 146 mg/dL (ref 0–149)
VLDL Cholesterol Cal: 26 mg/dL (ref 5–40)

## 2023-02-25 LAB — UA/M W/RFLX CULTURE, ROUTINE
Bilirubin, UA: NEGATIVE
Glucose, UA: NEGATIVE
Ketones, UA: NEGATIVE
Leukocytes,UA: NEGATIVE
Nitrite, UA: NEGATIVE
Protein,UA: NEGATIVE
RBC, UA: NEGATIVE
Specific Gravity, UA: 1.012 (ref 1.005–1.030)
Urobilinogen, Ur: 0.2 mg/dL (ref 0.2–1.0)
pH, UA: 7.5 (ref 5.0–7.5)

## 2023-03-08 ENCOUNTER — Ambulatory Visit: Payer: Self-pay | Admitting: Gastroenterology

## 2023-03-11 ENCOUNTER — Ambulatory Visit
Admission: RE | Admit: 2023-03-11 | Discharge: 2023-03-11 | Disposition: A | Discharge status: Home / Self Care | Payer: Disability Insurance | Source: Ambulatory Visit

## 2023-03-11 ENCOUNTER — Ambulatory Visit
Admission: RE | Admit: 2023-03-11 | Discharge: 2023-03-11 | Disposition: A | Discharge status: Home / Self Care | Payer: Disability Insurance

## 2023-03-11 DIAGNOSIS — M25511 Pain in right shoulder: Secondary | ICD-10-CM | POA: Diagnosis present

## 2023-03-16 ENCOUNTER — Other Ambulatory Visit: Payer: Self-pay

## 2023-03-17 ENCOUNTER — Other Ambulatory Visit: Payer: Self-pay

## 2023-03-24 ENCOUNTER — Ambulatory Visit: Payer: Medicaid Other | Admitting: Gerontology

## 2023-03-24 ENCOUNTER — Other Ambulatory Visit: Payer: Self-pay

## 2023-03-24 ENCOUNTER — Encounter: Payer: Self-pay | Admitting: Gerontology

## 2023-03-24 VITALS — BP 104/65 | HR 56 | Temp 98.0°F | Resp 16 | Ht 69.0 in | Wt 157.1 lb

## 2023-03-24 DIAGNOSIS — E782 Mixed hyperlipidemia: Secondary | ICD-10-CM

## 2023-03-24 DIAGNOSIS — Z0000 Encounter for general adult medical examination without abnormal findings: Secondary | ICD-10-CM

## 2023-03-24 DIAGNOSIS — I10 Essential (primary) hypertension: Secondary | ICD-10-CM

## 2023-03-24 MED ORDER — LOSARTAN POTASSIUM 25 MG PO TABS
25.0000 mg | ORAL_TABLET | Freq: Every day | ORAL | 1 refills | Status: AC
Start: 2023-03-24 — End: ?
  Filled 2023-03-24: qty 90, 90d supply, fill #0

## 2023-03-24 MED ORDER — ATORVASTATIN CALCIUM 10 MG PO TABS
10.0000 mg | ORAL_TABLET | Freq: Every evening | ORAL | 1 refills | Status: AC
Start: 2023-03-24 — End: ?
  Filled 2023-03-24 – 2023-05-31 (×2): qty 90, 90d supply, fill #0

## 2023-03-24 NOTE — Progress Notes (Signed)
Established Patient Office Visit  Subjective   Patient ID: Eduardo Golden, male    DOB: 02/18/1956  Age: 67 y.o. MRN: [id number]  Chief Complaint  Patient presents with   Follow-up    Labs drawn 02/24/23    HPI  Eduardo Golden is a 67 year old Caucasian male with a history of hypertension, hyperlipidemia, diverticulosis who presents for routine follow up visit . He states that he's compliant with his medications, denies side effects and continues to make healthy lifestyle changes. He had right shoulder x ray on 03/11/23 for his disability assessment, and it showed no evidence of fracture or dislocation. There is no evidence of arthropathy or other focal bone abnormality. Soft tissues are unremarkable. Lipid panel done on 02/24/23  was normal except LDL that was 107 mg/dl. Overall, he states that he's doing well and offers no further complaint.    Review of Systems  Constitutional: Negative.   Respiratory: Negative.    Cardiovascular: Negative.   Neurological: Negative.   Psychiatric/Behavioral: Negative.        Objective:     BP 104/65 (BP Location: Right Arm, Patient Position: Sitting, Cuff Size: Normal)   Pulse (!) 56   Temp 98 F (36.7 C) (Oral)   Resp 16   Ht 5\' 9"  (1.753 m)   Wt 157 lb 1.6 oz (71.3 kg)   SpO2 96%   BMI 23.20 kg/m  BP Readings from Last 3 Encounters:  03/24/23 104/65  02/24/23 116/72  11/25/22 116/69   Wt Readings from Last 3 Encounters:  03/24/23 157 lb 1.6 oz (71.3 kg)  02/24/23 161 lb 3.2 oz (73.1 kg)  11/25/22 166 lb (75.3 kg)      Physical Exam HENT:     Head: Normocephalic and atraumatic.     Mouth/Throat:     Mouth: Mucous membranes are moist.  Eyes:     Pupils: Pupils are equal, round, and reactive to light.  Cardiovascular:     Rate and Rhythm: Normal rate and regular rhythm.     Pulses: Normal pulses.     Heart sounds: Normal heart sounds.  Pulmonary:     Effort: Pulmonary effort is normal.     Breath sounds: Normal breath  sounds.  Neurological:     General: No focal deficit present.     Mental Status: He is alert and oriented to person, place, and time. Mental status is at baseline.  Psychiatric:        Mood and Affect: Mood normal.        Behavior: Behavior normal.        Thought Content: Thought content normal.        Judgment: Judgment normal.      No results found for any visits on 03/24/23.  Last CBC Lab Results  Component Value Date   WBC 11.2 (H) 10/14/2022   HGB 15.8 10/14/2022   HCT 45.3 10/14/2022   MCV 96 10/14/2022   MCH 33.4 (H) 10/14/2022   RDW 12.4 10/14/2022   PLT 439 10/14/2022   Last metabolic panel Lab Results  Component Value Date   GLUCOSE 83 10/14/2022   NA 139 10/14/2022   K 4.7 10/14/2022   CL 99 10/14/2022   CO2 21 10/14/2022   BUN 10 10/14/2022   CREATININE 0.70 (L) 10/14/2022   EGFR 104 10/14/2022   CALCIUM 9.3 10/14/2022   PROT 7.2 10/14/2022   ALBUMIN 4.7 10/14/2022   LABGLOB 2.5 10/14/2022   AGRATIO 1.9 10/14/2022   BILITOT  0.3 10/14/2022   ALKPHOS 97 10/14/2022   AST 17 10/14/2022   ALT 15 10/14/2022   ANIONGAP 8 09/23/2021   Last lipids Lab Results  Component Value Date   CHOL 189 02/24/2023   HDL 56 02/24/2023   LDLCALC 107 (H) 02/24/2023   TRIG 146 02/24/2023   CHOLHDL 3.4 02/24/2023   Last hemoglobin A1c Lab Results  Component Value Date   HGBA1C 5.5 10/14/2022   Last thyroid functions No results found for: "TSH", "T3TOTAL", "T4TOTAL", "THYROIDAB"    The 10-year ASCVD risk score (Arnett DK, et al., 2019) is: 12.3%    Assessment & Plan:   1. Mixed hyperlipidemia - His ASCVD is decreasing at 12.3%, he quit smoking, and will continue on current medication, low fat/cholesterol diet and exercise as tolerated. - atorvastatin (LIPITOR) 10 MG tablet; Take 1 tablet (10 mg total) by mouth every evening.  Dispense: 90 tablet; Refill: 1  2. Essential hypertension - His blood pressure is under control, will continue on current medication  regimen, DASH diet and exercise as tolerated. - losartan (COZAAR) 25 MG tablet; Take 1 tablet (25 mg total) by mouth daily.  Dispense: 90 tablet; Refill: 1  3. Healthcare maintenance - Routine labs will be checked. - Lipid panel; Future - Comp Met (CMET); Future - CBC w/Diff; Future - HgB A1c; Future   Return in about 27 weeks (around 09/29/2023), or if symptoms worsen or fail to improve.    Alianny Toelle Trellis Paganini, NP

## 2023-03-24 NOTE — Patient Instructions (Signed)
Heart-Healthy Eating Plan Many factors influence your heart health, including eating and exercise habits. Heart health is also called coronary health. Coronary risk increases with abnormal blood fat (lipid) levels. A heart-healthy eating plan includes limiting unhealthy fats, increasing healthy fats, limiting salt (sodium) intake, and making other diet and lifestyle changes. What is my plan? Your health care provider may recommend that: You limit your fat intake to _________% or less of your total calories each day. You limit your saturated fat intake to _________% or less of your total calories each day. You limit the amount of cholesterol in your diet to less than _________ mg per day. You limit the amount of sodium in your diet to less than _________ mg per day. What are tips for following this plan? Cooking Cook foods using methods other than frying. Baking, boiling, grilling, and broiling are all good options. Other ways to reduce fat include: Removing the skin from poultry. Removing all visible fats from meats. Steaming vegetables in water or broth. Meal planning  At meals, imagine dividing your plate into fourths: Fill one-half of your plate with vegetables and green salads. Fill one-fourth of your plate with whole grains. Fill one-fourth of your plate with lean protein foods. Eat 2-4 cups of vegetables per day. One cup of vegetables equals 1 cup (91 g) broccoli or cauliflower florets, 2 medium carrots, 1 large bell pepper, 1 large sweet potato, 1 large tomato, 1 medium white potato, 2 cups (150 g) raw leafy greens. Eat 1-2 cups of fruit per day. One cup of fruit equals 1 small apple, 1 large banana, 1 cup (237 g) mixed fruit, 1 large orange,  cup (82 g) dried fruit, 1 cup (240 mL) 100% fruit juice. Eat more foods that contain soluble fiber. Examples include apples, broccoli, carrots, beans, peas, and barley. Aim to get 25-30 g of fiber per day. Increase your consumption of legumes,  nuts, and seeds to 4-5 servings per week. One serving of dried beans or legumes equals  cup (90 g) cooked, 1 serving of nuts is  oz (12 almonds, 24 pistachios, or 7 walnut halves), and 1 serving of seeds equals  oz (8 g). Fats Choose healthy fats more often. Choose monounsaturated and polyunsaturated fats, such as olive and canola oils, avocado oil, flaxseeds, walnuts, almonds, and seeds. Eat more omega-3 fats. Choose salmon, mackerel, sardines, tuna, flaxseed oil, and ground flaxseeds. Aim to eat fish at least 2 times each week. Check food labels carefully to identify foods with trans fats or high amounts of saturated fat. Limit saturated fats. These are found in animal products, such as meats, butter, and cream. Plant sources of saturated fats include palm oil, palm kernel oil, and coconut oil. Avoid foods with partially hydrogenated oils in them. These contain trans fats. Examples are stick margarine, some tub margarines, cookies, crackers, and other baked goods. Avoid fried foods. General information Eat more home-cooked food and less restaurant, buffet, and fast food. Limit or avoid alcohol. Limit foods that are high in added sugar and simple starches such as foods made using white refined flour (white breads, pastries, sweets). Lose weight if you are overweight. Losing just 5-10% of your body weight can help your overall health and prevent diseases such as diabetes and heart disease. Monitor your sodium intake, especially if you have high blood pressure. Talk with your health care provider about your sodium intake. Try to incorporate more vegetarian meals weekly. What foods should I eat? Fruits All fresh, canned (in natural   juice), or frozen fruits. Vegetables Fresh or frozen vegetables (raw, steamed, roasted, or grilled). Green salads. Grains Most grains. Choose whole wheat and whole grains most of the time. Rice and pasta, including brown rice and pastas made with whole wheat. Meats  and other proteins Lean, well-trimmed beef, veal, pork, and lamb. Chicken and turkey without skin. All fish and shellfish. Wild duck, rabbit, pheasant, and venison. Egg whites or low-cholesterol egg substitutes. Dried beans, peas, lentils, and tofu. Seeds and most nuts. Dairy Low-fat or nonfat cheeses, including ricotta and mozzarella. Skim or 1% milk (liquid, powdered, or evaporated). Buttermilk made with low-fat milk. Nonfat or low-fat yogurt. Fats and oils Non-hydrogenated (trans-free) margarines. Vegetable oils, including soybean, sesame, sunflower, olive, avocado, peanut, safflower, corn, canola, and cottonseed. Salad dressings or mayonnaise made with a vegetable oil. Beverages Water (mineral or sparkling). Coffee and tea. Unsweetened ice tea. Diet beverages. Sweets and desserts Sherbet, gelatin, and fruit ice. Small amounts of dark chocolate. Limit all sweets and desserts. Seasonings and condiments All seasonings and condiments. The items listed above may not be a complete list of foods and beverages you can eat. Contact a dietitian for more options. What foods should I avoid? Fruits Canned fruit in heavy syrup. Fruit in cream or butter sauce. Fried fruit. Limit coconut. Vegetables Vegetables cooked in cheese, cream, or butter sauce. Fried vegetables. Grains Breads made with saturated or trans fats, oils, or whole milk. Croissants. Sweet rolls. Donuts. High-fat crackers, such as cheese crackers and chips. Meats and other proteins Fatty meats, such as hot dogs, ribs, sausage, bacon, rib-eye roast or steak. High-fat deli meats, such as salami and bologna. Caviar. Domestic duck and goose. Organ meats, such as liver. Dairy Cream, sour cream, cream cheese, and creamed cottage cheese. Whole-milk cheeses. Whole or 2% milk (liquid, evaporated, or condensed). Whole buttermilk. Cream sauce or high-fat cheese sauce. Whole-milk yogurt. Fats and oils Meat fat, or shortening. Cocoa butter,  hydrogenated oils, palm oil, coconut oil, palm kernel oil. Solid fats and shortenings, including bacon fat, salt pork, lard, and butter. Nondairy cream substitutes. Salad dressings with cheese or sour cream. Beverages Regular sodas and any drinks with added sugar. Sweets and desserts Frosting. Pudding. Cookies. Cakes. Pies. Milk chocolate or white chocolate. Buttered syrups. Full-fat ice cream or ice cream drinks. The items listed above may not be a complete list of foods and beverages to avoid. Contact a dietitian for more information. Summary Heart-healthy meal planning includes limiting unhealthy fats, increasing healthy fats, limiting salt (sodium) intake and making other diet and lifestyle changes. Lose weight if you are overweight. Losing just 5-10% of your body weight can help your overall health and prevent diseases such as diabetes and heart disease. Focus on eating a balance of foods, including fruits and vegetables, low-fat or nonfat dairy, lean protein, nuts and legumes, whole grains, and heart-healthy oils and fats. This information is not intended to replace advice given to you by your health care provider. Make sure you discuss any questions you have with your health care provider. Document Revised: 12/07/2021 Document Reviewed: 12/07/2021 Elsevier Patient Education  2023 Else[employer] Inc[employer] DASH Eating Plan DASH stands for Dietary Approaches to Stop Hypertension. The DASH eating plan is a healthy eating plan that has been shown to: Reduce high blood pressure (hypertension). Reduce your risk for type 2 diabetes, heart disease, and stroke. Help with weight loss. What are tips for following this plan? Reading food labels Check food labels for the amount of salt (sodium) per serving. Choose   foods with less than 5 percent of the Daily Value of sodium. Generally, foods with less than 300 milligrams (mg) of sodium per serving fit into this eating plan. To find whole grains, look for the word  "whole" as the first word in the ingredient list. Shopping Buy products labeled as "low-sodium" or "no salt added." Buy fresh foods. Avoid canned foods and pre-made or frozen meals. Cooking Avoid adding salt when cooking. Use salt-free seasonings or herbs instead of table salt or sea salt. Check with your health care provider or pharmacist before using salt substitutes. Do not fry foods. Cook foods using healthy methods such as baking, boiling, grilling, roasting, and broiling instead. Cook with heart-healthy oils, such as olive, canola, avocado, soybean, or sunflower oil. Meal planning  Eat a balanced diet that includes: 4 or more servings of fruits and 4 or more servings of vegetables each day. Try to fill one-half of your plate with fruits and vegetables. 6-8 servings of whole grains each day. Less than 6 oz (170 g) of lean meat, poultry, or fish each day. A 3-oz (85-g) serving of meat is about the same size as a deck of cards. One egg equals 1 oz (28 g). 2-3 servings of low-fat dairy each day. One serving is 1 cup (237 mL). 1 serving of nuts, seeds, or beans 5 times each week. 2-3 servings of heart-healthy fats. Healthy fats called omega-3 fatty acids are found in foods such as walnuts, flaxseeds, fortified milks, and eggs. These fats are also found in cold-water fish, such as sardines, salmon, and mackerel. Limit how much you eat of: Canned or prepackaged foods. Food that is high in trans fat, such as some fried foods. Food that is high in saturated fat, such as fatty meat. Desserts and other sweets, sugary drinks, and other foods with added sugar. Full-fat dairy products. Do not salt foods before eating. Do not eat more than 4 egg yolks a week. Try to eat at least 2 vegetarian meals a week. Eat more home-cooked food and less restaurant, buffet, and fast food. Lifestyle When eating at a restaurant, ask that your food be prepared with less salt or no salt, if possible. If you drink  alcohol: Limit how much you use to: 0-1 drink a day for women who are not pregnant. 0-2 drinks a day for men. Be aware of how much alcohol is in your drink. In the [place] one drink equals one 12 oz bottle of beer (355 mL), one 5 oz glass of wine (148 mL), or one 1 oz glass of hard liquor (44 mL). General information Avoid eating more than 2,300 mg of salt a day. If you have hypertension, you may need to reduce your sodium intake to 1,500 mg a day. Work with your health care provider to maintain a healthy body weight or to lose weight. Ask what an ideal weight is for you. Get at least 30 minutes of exercise that causes your heart to beat faster (aerobic exercise) most days of the week. Activities may include walking, swimming, or biking. Work with your health care provider or dietitian to adjust your eating plan to your individual calorie needs. What foods should I eat? Fruits All fresh, dried, or frozen fruit. Canned fruit in natural juice (without added sugar). Vegetables Fresh or frozen vegetables (raw, steamed, roasted, or grilled). Low-sodium or reduced-sodium tomato and vegetable juice. Low-sodium or reduced-sodium tomato sauce and tomato paste. Low-sodium or reduced-sodium canned vegetables. Grains Whole-grain or whole-wheat bread. Whole-grain or   whole-wheat pasta. Brown rice. Oatmeal. Quinoa. Bulgur. Whole-grain and low-sodium cereals. Pita bread. Low-fat, low-sodium crackers. Whole-wheat flour tortillas. Meats and other proteins Skinless chicken or turkey. Ground chicken or turkey. Pork with fat trimmed off. Fish and seafood. Egg whites. Dried beans, peas, or lentils. Unsalted nuts, nut butters, and seeds. Unsalted canned beans. Lean cuts of beef with fat trimmed off. Low-sodium, lean precooked or cured meat, such as sausages or meat loaves. Dairy Low-fat (1%) or fat-free (skim) milk. Reduced-fat, low-fat, or fat-free cheeses. Nonfat, low-sodium ricotta or cottage cheese. Low-fat or  nonfat yogurt. Low-fat, low-sodium cheese. Fats and oils Soft margarine without trans fats. Vegetable oil. Reduced-fat, low-fat, or light mayonnaise and salad dressings (reduced-sodium). Canola, safflower, olive, avocado, soybean, and sunflower oils. Avocado. Seasonings and condiments Herbs. Spices. Seasoning mixes without salt. Other foods Unsalted popcorn and pretzels. Fat-free sweets. The items listed above may not be a complete list of foods and beverages you can eat. Contact a dietitian for more information. What foods should I avoid? Fruits Canned fruit in a light or heavy syrup. Fried fruit. Fruit in cream or butter sauce. Vegetables Creamed or fried vegetables. Vegetables in a cheese sauce. Regular canned vegetables (not low-sodium or reduced-sodium). Regular canned tomato sauce and paste (not low-sodium or reduced-sodium). Regular tomato and vegetable juice (not low-sodium or reduced-sodium). Pickles. Olives. Grains Baked goods made with fat, such as croissants, muffins, or some breads. Dry pasta or rice meal packs. Meats and other proteins Fatty cuts of meat. Ribs. Fried meat. Bacon. Bologna, salami, and other precooked or cured meats, such as sausages or meat loaves. Fat from the back of a pig (fatback). Bratwurst. Salted nuts and seeds. Canned beans with added salt. Canned or smoked fish. Whole eggs or egg yolks. Chicken or turkey with skin. Dairy Whole or 2% milk, cream, and half-and-half. Whole or full-fat cream cheese. Whole-fat or sweetened yogurt. Full-fat cheese. Nondairy creamers. Whipped toppings. Processed cheese and cheese spreads. Fats and oils Butter. Stick margarine. Lard. Shortening. Ghee. Bacon fat. Tropical oils, such as coconut, palm kernel, or palm oil. Seasonings and condiments Onion salt, garlic salt, seasoned salt, table salt, and sea salt. Worcestershire sauce. Tartar sauce. Barbecue sauce. Teriyaki sauce. Soy sauce, including reduced-sodium. Steak sauce.  Canned and packaged gravies. Fish sauce. Oyster sauce. Cocktail sauce. Store-bought horseradish. Ketchup. Mustard. Meat flavorings and tenderizers. Bouillon cubes. Hot sauces. Pre-made or packaged marinades. Pre-made or packaged taco seasonings. Relishes. Regular salad dressings. Other foods Salted popcorn and pretzels. The items listed above may not be a complete list of foods and beverages you should avoid. Contact a dietitian for more information. Where to find more information National Heart, Lung, and Blood Institute: www.nhlbi.nih.gov American Heart Association: www.heart.org Academy of Nutrition and Dietetics: www.eatright.org National Kidney Foundation: www.kidney.org Summary The DASH eating plan is a healthy eating plan that has been shown to reduce high blood pressure (hypertension). It may also reduce your risk for type 2 diabetes, heart disease, and stroke. When on the DASH eating plan, aim to eat more fresh fruits and vegetables, whole grains, lean proteins, low-fat dairy, and heart-healthy fats. With the DASH eating plan, you should limit salt (sodium) intake to 2,300 mg a day. If you have hypertension, you may need to reduce your sodium intake to 1,500 mg a day. Work with your health care provider or dietitian to adjust your eating plan to your individual calorie needs. This information is not intended to replace advice given to you by your health care provider. Make sure   you discuss any questions you have with your health care provider. Document Revised: 10/05/2019 Document Reviewed: 10/05/2019 Elsevier Patient Education  2023 Elsevier Inc.  

## 2023-03-28 ENCOUNTER — Other Ambulatory Visit: Payer: Self-pay

## 2023-03-28 ENCOUNTER — Encounter: Payer: Self-pay | Admitting: Gastroenterology

## 2023-03-28 ENCOUNTER — Ambulatory Visit (INDEPENDENT_AMBULATORY_CARE_PROVIDER_SITE_OTHER): Payer: Self-pay | Admitting: Gastroenterology

## 2023-03-28 VITALS — BP 146/78 | HR 56 | Temp 97.8°F | Ht 69.0 in | Wt 160.4 lb

## 2023-03-28 DIAGNOSIS — R194 Change in bowel habit: Secondary | ICD-10-CM

## 2023-03-28 DIAGNOSIS — Z8719 Personal history of other diseases of the digestive system: Secondary | ICD-10-CM

## 2023-03-28 MED ORDER — PEG 3350-KCL-NA BICARB-NACL 420 G PO SOLR
ORAL | 0 refills | Status: DC
  Filled 2023-03-28: qty 4000, 1d supply, fill #0

## 2023-03-28 MED ORDER — NA SULFATE-K SULFATE-MG SULF 17.5-3.13-1.6 GM/177ML PO SOLN
354.0000 mL | Freq: Once | ORAL | 0 refills | Status: AC
  Filled 2023-03-28: qty 354, 1d supply, fill #0

## 2023-03-28 NOTE — Addendum Note (Signed)
Addended by: Adela Ports on: 03/28/2023 03:16 PM   Modules accepted: Orders

## 2023-03-28 NOTE — Addendum Note (Signed)
Addended by: Adela Ports on: 03/28/2023 03:01 PM   Modules accepted: Orders
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
## 2023-04-19 ENCOUNTER — Telehealth: Payer: Self-pay

## 2023-04-19 ENCOUNTER — Encounter: Payer: Self-pay | Admitting: Gastroenterology

## 2023-04-19 NOTE — Telephone Encounter (Signed)
Called patient back and let him know that the reasoning of taking Golytely instead of Suprep, it's because his insurance didn't cover it and it had to be switched. I then explained how he is to see his watery stools in the morning to make sure that he is completely cleaned out. But if he saw formed stools, then he will need to call the endoscopy unit and let them know that he will need to reschedule and we will have to put him on a 2 day prep. Patient understood and had no further questions.

## 2023-04-19 NOTE — Telephone Encounter (Signed)
Patient is calling and has some questions about his colonoscopy tomorrow. He left 2 messages this morning

## 2023-04-20 ENCOUNTER — Telehealth: Payer: Self-pay

## 2023-04-20 ENCOUNTER — Encounter: Admission: RE | Payer: Self-pay | Source: Home / Self Care

## 2023-04-20 ENCOUNTER — Other Ambulatory Visit: Payer: Self-pay

## 2023-04-20 ENCOUNTER — Ambulatory Visit: Admission: RE | Admit: 2023-04-20 | Payer: Medicaid Other | Source: Home / Self Care | Admitting: Gastroenterology

## 2023-04-20 DIAGNOSIS — Z8719 Personal history of other diseases of the digestive system: Secondary | ICD-10-CM

## 2023-04-20 DIAGNOSIS — R194 Change in bowel habit: Secondary | ICD-10-CM

## 2023-04-20 SURGERY — COLONOSCOPY WITH PROPOFOL
Anesthesia: General

## 2023-04-20 MED ORDER — NA SULFATE-K SULFATE-MG SULF 17.5-3.13-1.6 GM/177ML PO SOLN
354.0000 mL | Freq: Once | ORAL | 0 refills | Status: AC
  Filled 2023-04-20: qty 354, 1d supply, fill #0

## 2023-04-20 NOTE — Telephone Encounter (Signed)
Patient called me back and was instructed to do a 2 days prep and asked when he would like to schedule his procedure. Patient and wife agreed on 06/08/2023. I told them that I would be sending them their new instructions by mail and their new prescription to their pharmacy. Patient and wife did not have any further questions.

## 2023-04-20 NOTE — Telephone Encounter (Signed)
Received staff message from [place] stating patient needs to reschedule due to not being cleaned out. Chart reviewed prior to call back.  Maritza noted that patient should call office if he still is not cleaned out and to have him do a 2 day prep.  Message routed to [facility] to reschedule and advise on 2 day prep.  Thanks, [place], [place]

## 2023-04-20 NOTE — Telephone Encounter (Signed)
Called patient back and had to leave him a voicemail to return my call. 

## 2023-04-20 NOTE — Addendum Note (Signed)
Addended by: Adela Ports on: 04/20/2023 01:55 PM   Modules accepted: Orders

## 2023-04-21 ENCOUNTER — Other Ambulatory Visit: Payer: Self-pay

## 2023-04-22 ENCOUNTER — Telehealth: Payer: Self-pay | Admitting: Gastroenterology

## 2023-04-22 NOTE — Telephone Encounter (Signed)
Pt wife Paula Compton called to cancel pt procedure for 06/08/2023 will call back and reschedule

## 2023-04-25 ENCOUNTER — Telehealth: Payer: Self-pay | Admitting: Gastroenterology

## 2023-04-25 MED ORDER — NA SULFATE-K SULFATE-MG SULF 17.5-3.13-1.6 GM/177ML PO SOLN: 1.0000 | Freq: Once | ORAL | 0 refills | Status: AC

## 2023-04-25 NOTE — Addendum Note (Signed)
Addended by: Tawnya Crook on: 04/25/2023 10:41 AM   Modules accepted: Orders

## 2023-04-25 NOTE — Telephone Encounter (Signed)
They did not call to cancel procedure. Patient's wife called to change pharmacy for the suprep. She left a message on Michelle's phone as well. Suprep was sent to [facility] in [place] as requested.

## 2023-04-25 NOTE — Telephone Encounter (Signed)
Spoken to patient's wife. I have sent Suprep to [facility] in [place] as requested.

## 2023-04-25 NOTE — Telephone Encounter (Signed)
Pt  wife karla left message needing a different prep for pt colonoscopy please return call

## 2023-05-05 ENCOUNTER — Other Ambulatory Visit: Payer: Self-pay

## 2023-05-25 ENCOUNTER — Ambulatory Visit (INDEPENDENT_AMBULATORY_CARE_PROVIDER_SITE_OTHER): Payer: Self-pay | Admitting: Physician Assistant

## 2023-05-25 ENCOUNTER — Encounter: Payer: Self-pay | Admitting: Physician Assistant

## 2023-05-25 VITALS — BP 103/64 | HR 74 | Temp 98.1°F | Ht 69.0 in | Wt 148.0 lb

## 2023-05-25 DIAGNOSIS — Z8719 Personal history of other diseases of the digestive system: Secondary | ICD-10-CM

## 2023-05-25 DIAGNOSIS — R110 Nausea: Secondary | ICD-10-CM

## 2023-05-25 DIAGNOSIS — R634 Abnormal weight loss: Secondary | ICD-10-CM

## 2023-05-25 DIAGNOSIS — R1032 Left lower quadrant pain: Secondary | ICD-10-CM

## 2023-05-25 DIAGNOSIS — R1031 Right lower quadrant pain: Secondary | ICD-10-CM

## 2023-05-25 NOTE — Patient Instructions (Addendum)
Your CT scan has been scheduled at [facility], [facility] entrance for July 17th arrive at 9:45 AM. You must arrive at to register.   You can not have anything to eat or drink 4 hours prior  If you need to cancel or reschedule scan, please call scheduling directly at [phone]
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
## 2023-05-26 ENCOUNTER — Encounter: Payer: Self-pay | Admitting: Physician Assistant

## 2023-05-26 MED ORDER — ONDANSETRON HCL 4 MG PO TABS
4.0000 mg | ORAL_TABLET | Freq: Three times a day (TID) | ORAL | 0 refills | Status: AC | PRN
Start: 2023-05-26 — End: 2023-06-25

## 2023-05-31 ENCOUNTER — Other Ambulatory Visit: Payer: Self-pay

## 2023-06-01 ENCOUNTER — Ambulatory Visit: Admission: RE | Admit: 2023-06-01 | Payer: Disability Insurance | Source: Ambulatory Visit

## 2023-06-07 ENCOUNTER — Encounter: Payer: Self-pay | Admitting: Gastroenterology

## 2023-06-08 ENCOUNTER — Ambulatory Visit: Payer: Self-pay

## 2023-06-08 ENCOUNTER — Ambulatory Visit
Admission: RE | Admit: 2023-06-08 | Discharge: 2023-06-08 | Disposition: A | Discharge status: Home / Self Care | Payer: Self-pay | Attending: Gastroenterology | Admitting: Gastroenterology

## 2023-06-08 ENCOUNTER — Encounter
Admission: RE | Disposition: A | Discharge status: Home / Self Care | Payer: Self-pay | Source: Home / Self Care | Attending: Gastroenterology

## 2023-06-08 DIAGNOSIS — I10 Essential (primary) hypertension: Secondary | ICD-10-CM | POA: Insufficient documentation

## 2023-06-08 DIAGNOSIS — Z8719 Personal history of other diseases of the digestive system: Secondary | ICD-10-CM

## 2023-06-08 DIAGNOSIS — Z09 Encounter for follow-up examination after completed treatment for conditions other than malignant neoplasm: Secondary | ICD-10-CM | POA: Insufficient documentation

## 2023-06-08 DIAGNOSIS — Z79899 Other long term (current) drug therapy: Secondary | ICD-10-CM | POA: Insufficient documentation

## 2023-06-08 DIAGNOSIS — J449 Chronic obstructive pulmonary disease, unspecified: Secondary | ICD-10-CM | POA: Insufficient documentation

## 2023-06-08 DIAGNOSIS — K5732 Diverticulitis of large intestine without perforation or abscess without bleeding: Secondary | ICD-10-CM | POA: Insufficient documentation

## 2023-06-08 DIAGNOSIS — Z87891 Personal history of nicotine dependence: Secondary | ICD-10-CM | POA: Insufficient documentation

## 2023-06-08 DIAGNOSIS — R194 Change in bowel habit: Secondary | ICD-10-CM

## 2023-06-08 HISTORY — PX: COLONOSCOPY WITH PROPOFOL: SHX5780

## 2023-06-08 SURGERY — COLONOSCOPY WITH PROPOFOL
Anesthesia: General

## 2023-06-08 MED ORDER — PROPOFOL 500 MG/50ML IV EMUL
INTRAVENOUS | Status: DC | PRN
  Administered 2023-06-08: 100 ug/kg/min via INTRAVENOUS

## 2023-06-08 MED ORDER — LIDOCAINE HCL (CARDIAC) PF 100 MG/5ML IV SOSY
PREFILLED_SYRINGE | INTRAVENOUS | Status: DC | PRN
  Administered 2023-06-08: 60 mg via INTRAVENOUS

## 2023-06-08 MED ORDER — EPHEDRINE SULFATE (PRESSORS) 50 MG/ML IJ SOLN
INTRAMUSCULAR | Status: DC | PRN
  Administered 2023-06-08: 5 mg via INTRAVENOUS

## 2023-06-08 MED ORDER — SODIUM CHLORIDE 0.9 % IV SOLN
INTRAVENOUS | Status: DC
  Administered 2023-06-08: 20 mL/h via INTRAVENOUS

## 2023-06-08 MED ORDER — PROPOFOL 10 MG/ML IV BOLUS
INTRAVENOUS | Status: DC | PRN
  Administered 2023-06-08: 60 mg via INTRAVENOUS
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
## 2023-06-08 NOTE — Transfer of Care (Signed)
Immediate Anesthesia Transfer of Care Note  Patient: Eduardo Golden  Procedure(s) Performed: COLONOSCOPY WITH PROPOFOL  Patient Location: PACU  Anesthesia Type:General  Level of Consciousness: awake, alert , and oriented  Airway & Oxygen Therapy: Patient Spontanous Breathing  Post-op Assessment: Report given to RN and Post -op Vital signs reviewed and stable  Post vital signs: stable  Last Vitals:  Vitals Value Taken Time  BP 113/53 06/08/23 1152  Temp 36.5 C 06/08/23 1150  Pulse 64 06/08/23 1159  Resp 22 06/08/23 1159  SpO2 99 % 06/08/23 1159  Vitals shown include unfiled device data.  Last Pain:  Vitals:   06/08/23 1150  TempSrc: Temporal  PainSc: 0-No pain         Complications: No notable events documented.
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
## 2023-06-08 NOTE — Op Note (Signed)
[facility] Gastroenterology Patient Name: Eduardo Golden Procedure Date: 06/08/2023 11:32 AM MRN: [id number] Account #: [id number] Date of Birth: 11-Aug-1956 Admit Type: Outpatient Age: 67 Room: [facility] ENDO ROOM 2 Gender: Male Note Status: Finalized Instrument Name: Peds Colonoscope [id number] Procedure:             Colonoscopy Indications:           Follow-up of diverticulitis Providers:             Wyline Mood MD, MD Medicines:             Monitored Anesthesia Care Complications:         No immediate complications. Procedure:             Pre-Anesthesia Assessment:                        - Prior to the procedure, a History and Physical was                         performed, and patient medications, allergies and                         sensitivities were reviewed. The patient's tolerance                         of previous anesthesia was reviewed.                        - The risks and benefits of the procedure and the                         sedation options and risks were discussed with the                         patient. All questions were answered and informed                         consent was obtained.                        - ASA Grade Assessment: II - A patient with mild                         systemic disease.                        After obtaining informed consent, the colonoscope was                         passed under direct vision. Throughout the procedure,                         the patient's blood pressure, pulse, and oxygen                         saturations were monitored continuously. The                         Colonoscope was introduced through the anus with the  intention of advancing to the cecum. The scope was                         advanced to the sigmoid colon before the procedure was                         aborted. Medications were given. The colonoscopy was                         performed with ease. The  patient tolerated the                         procedure well. The quality of the bowel preparation                         was inadequate. Findings:      A large amount of semi-solid stool was found in the rectum and in the       sigmoid colon, interfering with visualization. Impression:            - Preparation of the colon was inadequate.                        - Stool in the rectum and in the sigmoid colon.                        - No specimens collected. Recommendation:        - Discharge patient to home (with escort).                        - Resume previous diet.                        - Continue present medications.                        - Repeat colonoscopy in 2 weeks because the bowel                         preparation was suboptimal. Procedure Code(s):     --- Professional ---                        [phone], 53, Colonoscopy, flexible; diagnostic,                         including collection of specimen(s) by brushing or                         washing, when performed (separate procedure) Diagnosis Code(s):     --- Professional ---                        G40.10, Diverticulitis of large intestine without                         perforation or abscess without bleeding CPT copyright 2022 American Medical Association. All rights reserved. The codes documented in this report are preliminary and upon coder review may  be revised to meet current compliance requirements. Wyline Mood, MD Wyline Mood  MD, MD 06/08/2023 11:42:31 AM This report has been signed electronically. Number of Addenda: 0 Note Initiated On: 06/08/2023 11:32 AM Total Procedure Duration: 0 hours 0 minutes 43 seconds  Estimated Blood Loss:  Estimated blood loss: none.      [facility]

## 2023-06-08 NOTE — OR Nursing (Signed)
Inadequate prep cancel procedure.

## 2023-06-08 NOTE — OR Nursing (Signed)
RN SPOKE WITH PT TO RE-INSTRUCT THAT MD WANTS TO RE-DO PROCEDURE IN 2 WEEKS. PT REPORTS HE IS NOT INTERESTED AND BESIDES HE WAS CLOSING ON HIS HOME AND MOVING TO [place]. R/I PT TO FIND GI DOCTOR IN [place]. UNDERSTANDING VOICED. DR ANNA INFORMED

## 2023-06-08 NOTE — Anesthesia Postprocedure Evaluation (Signed)
Anesthesia Post Note  Patient: Eduardo Golden  Procedure(s) Performed: COLONOSCOPY WITH PROPOFOL  Patient location during evaluation: Endoscopy Anesthesia Type: General Level of consciousness: awake and alert Pain management: pain level controlled Vital Signs Assessment: post-procedure vital signs reviewed and stable Respiratory status: spontaneous breathing, nonlabored ventilation, respiratory function stable and patient connected to nasal cannula oxygen Cardiovascular status: blood pressure returned to baseline and stable Postop Assessment: no apparent nausea or vomiting Anesthetic complications: no  No notable events documented.   Last Vitals:  Vitals:   06/08/23 1150 06/08/23 1200  BP: (!) 113/53 135/67  Pulse: 65   Resp: 18   Temp: 36.5 C   SpO2: 99%     Last Pain:  Vitals:   06/08/23 1200  TempSrc:   PainSc: 0-No pain                 Stephanie Coup

## 2023-06-09 ENCOUNTER — Encounter: Payer: Self-pay | Admitting: Gastroenterology

## 2023-09-21 ENCOUNTER — Other Ambulatory Visit: Payer: Medicaid Other

## 2023-09-28 ENCOUNTER — Ambulatory Visit: Payer: Self-pay | Admitting: Gerontology

## 2023-10-11 ENCOUNTER — Telehealth: Payer: Self-pay

## 2023-10-11 NOTE — Telephone Encounter (Signed)
Patient had colonoscopy on 06/08/2023 and was supposed to repeat colonoscopy in 2 weeks because he was not clean out. Called patient and Patient wife Eduardo Golden answered who is on patient DPR form and she verbalized understanding and states that they are on vacation not in [place] and will call back when they get in town to schedule
